# Patient Record
Sex: Female | Born: 1940 | Race: White | Hispanic: No | Marital: Married | Smoking: Never smoker
Health system: Southern US, Community
[De-identification: ages and names within clinical notes are randomized; demographics above are authoritative.]

## PROBLEM LIST (undated history)

## (undated) DIAGNOSIS — I10 Essential (primary) hypertension: Secondary | ICD-10-CM

## (undated) DIAGNOSIS — G4733 Obstructive sleep apnea (adult) (pediatric): Secondary | ICD-10-CM

## (undated) DIAGNOSIS — Z9889 Other specified postprocedural states: Secondary | ICD-10-CM

## (undated) DIAGNOSIS — R51 Headache: Secondary | ICD-10-CM

## (undated) DIAGNOSIS — R519 Headache, unspecified: Secondary | ICD-10-CM

## (undated) DIAGNOSIS — K219 Gastro-esophageal reflux disease without esophagitis: Secondary | ICD-10-CM

## (undated) DIAGNOSIS — I2510 Atherosclerotic heart disease of native coronary artery without angina pectoris: Secondary | ICD-10-CM

## (undated) DIAGNOSIS — R112 Nausea with vomiting, unspecified: Secondary | ICD-10-CM

## (undated) DIAGNOSIS — M1990 Unspecified osteoarthritis, unspecified site: Secondary | ICD-10-CM

## (undated) DIAGNOSIS — E119 Type 2 diabetes mellitus without complications: Secondary | ICD-10-CM

## (undated) DIAGNOSIS — E785 Hyperlipidemia, unspecified: Secondary | ICD-10-CM

## (undated) HISTORY — PX: CORONARY ANGIOPLASTY: SHX604

## (undated) HISTORY — PX: TONSILLECTOMY AND ADENOIDECTOMY: SUR1326

## (undated) HISTORY — PX: CHOLECYSTECTOMY: SHX55

## (undated) HISTORY — PX: AXILLARY LYMPH NODE DISSECTION: SHX5229

## (undated) HISTORY — PX: VAGINAL HYSTERECTOMY: SUR661

---

## 2007-08-05 HISTORY — PX: CORONARY ARTERY BYPASS GRAFT: SHX141

## 2007-08-06 ENCOUNTER — Ambulatory Visit: Payer: Self-pay | Admitting: Surgery

## 2007-09-22 ENCOUNTER — Ambulatory Visit: Payer: Self-pay | Admitting: Thoracic Surgery (Cardiothoracic Vascular Surgery)

## 2007-11-24 ENCOUNTER — Ambulatory Visit: Payer: Self-pay | Admitting: Thoracic Surgery (Cardiothoracic Vascular Surgery)

## 2011-08-21 DIAGNOSIS — J209 Acute bronchitis, unspecified: Secondary | ICD-10-CM | POA: Diagnosis not present

## 2011-08-21 DIAGNOSIS — E663 Overweight: Secondary | ICD-10-CM | POA: Diagnosis not present

## 2011-10-28 DIAGNOSIS — E119 Type 2 diabetes mellitus without complications: Secondary | ICD-10-CM | POA: Diagnosis not present

## 2011-12-08 DIAGNOSIS — L570 Actinic keratosis: Secondary | ICD-10-CM | POA: Diagnosis not present

## 2011-12-08 DIAGNOSIS — L821 Other seborrheic keratosis: Secondary | ICD-10-CM | POA: Diagnosis not present

## 2011-12-18 DIAGNOSIS — M1710 Unilateral primary osteoarthritis, unspecified knee: Secondary | ICD-10-CM | POA: Diagnosis not present

## 2011-12-19 DIAGNOSIS — M1710 Unilateral primary osteoarthritis, unspecified knee: Secondary | ICD-10-CM | POA: Diagnosis not present

## 2011-12-25 DIAGNOSIS — M1710 Unilateral primary osteoarthritis, unspecified knee: Secondary | ICD-10-CM | POA: Diagnosis not present

## 2011-12-30 DIAGNOSIS — M1710 Unilateral primary osteoarthritis, unspecified knee: Secondary | ICD-10-CM | POA: Diagnosis not present

## 2012-01-01 DIAGNOSIS — M1710 Unilateral primary osteoarthritis, unspecified knee: Secondary | ICD-10-CM | POA: Diagnosis not present

## 2012-01-03 DIAGNOSIS — H66009 Acute suppurative otitis media without spontaneous rupture of ear drum, unspecified ear: Secondary | ICD-10-CM | POA: Diagnosis not present

## 2012-01-08 DIAGNOSIS — M1710 Unilateral primary osteoarthritis, unspecified knee: Secondary | ICD-10-CM | POA: Diagnosis not present

## 2012-02-19 DIAGNOSIS — E559 Vitamin D deficiency, unspecified: Secondary | ICD-10-CM | POA: Diagnosis not present

## 2012-02-19 DIAGNOSIS — E119 Type 2 diabetes mellitus without complications: Secondary | ICD-10-CM | POA: Diagnosis not present

## 2012-02-19 DIAGNOSIS — Z79899 Other long term (current) drug therapy: Secondary | ICD-10-CM | POA: Diagnosis not present

## 2012-02-19 DIAGNOSIS — M25569 Pain in unspecified knee: Secondary | ICD-10-CM | POA: Diagnosis not present

## 2012-03-21 DIAGNOSIS — J029 Acute pharyngitis, unspecified: Secondary | ICD-10-CM | POA: Diagnosis not present

## 2012-03-21 DIAGNOSIS — J019 Acute sinusitis, unspecified: Secondary | ICD-10-CM | POA: Diagnosis not present

## 2012-05-06 DIAGNOSIS — M1710 Unilateral primary osteoarthritis, unspecified knee: Secondary | ICD-10-CM | POA: Diagnosis not present

## 2012-05-06 DIAGNOSIS — M21169 Varus deformity, not elsewhere classified, unspecified knee: Secondary | ICD-10-CM | POA: Diagnosis not present

## 2012-05-06 DIAGNOSIS — M25569 Pain in unspecified knee: Secondary | ICD-10-CM | POA: Diagnosis not present

## 2012-05-06 DIAGNOSIS — R269 Unspecified abnormalities of gait and mobility: Secondary | ICD-10-CM | POA: Diagnosis not present

## 2012-05-11 DIAGNOSIS — L819 Disorder of pigmentation, unspecified: Secondary | ICD-10-CM | POA: Diagnosis not present

## 2012-05-28 DIAGNOSIS — J209 Acute bronchitis, unspecified: Secondary | ICD-10-CM | POA: Diagnosis not present

## 2012-06-03 DIAGNOSIS — J209 Acute bronchitis, unspecified: Secondary | ICD-10-CM | POA: Diagnosis not present

## 2012-06-03 DIAGNOSIS — E119 Type 2 diabetes mellitus without complications: Secondary | ICD-10-CM | POA: Diagnosis not present

## 2012-06-07 DIAGNOSIS — M940 Chondrocostal junction syndrome [Tietze]: Secondary | ICD-10-CM | POA: Diagnosis not present

## 2012-06-07 DIAGNOSIS — I10 Essential (primary) hypertension: Secondary | ICD-10-CM | POA: Diagnosis not present

## 2012-06-07 DIAGNOSIS — I2510 Atherosclerotic heart disease of native coronary artery without angina pectoris: Secondary | ICD-10-CM | POA: Diagnosis not present

## 2012-06-07 DIAGNOSIS — E785 Hyperlipidemia, unspecified: Secondary | ICD-10-CM | POA: Diagnosis not present

## 2012-06-15 DIAGNOSIS — I2510 Atherosclerotic heart disease of native coronary artery without angina pectoris: Secondary | ICD-10-CM | POA: Diagnosis not present

## 2012-06-15 DIAGNOSIS — E785 Hyperlipidemia, unspecified: Secondary | ICD-10-CM | POA: Diagnosis not present

## 2012-07-12 DIAGNOSIS — Z23 Encounter for immunization: Secondary | ICD-10-CM | POA: Diagnosis not present

## 2012-07-22 DIAGNOSIS — Z1231 Encounter for screening mammogram for malignant neoplasm of breast: Secondary | ICD-10-CM | POA: Diagnosis not present

## 2012-08-11 DIAGNOSIS — E119 Type 2 diabetes mellitus without complications: Secondary | ICD-10-CM | POA: Diagnosis not present

## 2012-08-11 DIAGNOSIS — E7800 Pure hypercholesterolemia, unspecified: Secondary | ICD-10-CM | POA: Diagnosis not present

## 2012-08-11 DIAGNOSIS — I10 Essential (primary) hypertension: Secondary | ICD-10-CM | POA: Diagnosis not present

## 2012-08-12 DIAGNOSIS — E559 Vitamin D deficiency, unspecified: Secondary | ICD-10-CM | POA: Diagnosis not present

## 2012-08-12 DIAGNOSIS — E119 Type 2 diabetes mellitus without complications: Secondary | ICD-10-CM | POA: Diagnosis not present

## 2012-08-12 DIAGNOSIS — Z79899 Other long term (current) drug therapy: Secondary | ICD-10-CM | POA: Diagnosis not present

## 2012-08-31 DIAGNOSIS — L820 Inflamed seborrheic keratosis: Secondary | ICD-10-CM | POA: Diagnosis not present

## 2012-10-13 DIAGNOSIS — R269 Unspecified abnormalities of gait and mobility: Secondary | ICD-10-CM | POA: Diagnosis not present

## 2012-10-13 DIAGNOSIS — M21169 Varus deformity, not elsewhere classified, unspecified knee: Secondary | ICD-10-CM | POA: Diagnosis not present

## 2012-10-13 DIAGNOSIS — M6281 Muscle weakness (generalized): Secondary | ICD-10-CM | POA: Diagnosis not present

## 2012-11-15 DIAGNOSIS — M1710 Unilateral primary osteoarthritis, unspecified knee: Secondary | ICD-10-CM | POA: Diagnosis not present

## 2012-11-15 DIAGNOSIS — J029 Acute pharyngitis, unspecified: Secondary | ICD-10-CM | POA: Diagnosis not present

## 2012-11-23 DIAGNOSIS — M1710 Unilateral primary osteoarthritis, unspecified knee: Secondary | ICD-10-CM | POA: Diagnosis not present

## 2012-11-29 DIAGNOSIS — M1710 Unilateral primary osteoarthritis, unspecified knee: Secondary | ICD-10-CM | POA: Diagnosis not present

## 2012-12-18 DIAGNOSIS — M25569 Pain in unspecified knee: Secondary | ICD-10-CM | POA: Diagnosis not present

## 2012-12-30 DIAGNOSIS — M1710 Unilateral primary osteoarthritis, unspecified knee: Secondary | ICD-10-CM | POA: Diagnosis not present

## 2013-02-09 DIAGNOSIS — E782 Mixed hyperlipidemia: Secondary | ICD-10-CM | POA: Diagnosis not present

## 2013-02-09 DIAGNOSIS — Z006 Encounter for examination for normal comparison and control in clinical research program: Secondary | ICD-10-CM | POA: Diagnosis not present

## 2013-02-09 DIAGNOSIS — E119 Type 2 diabetes mellitus without complications: Secondary | ICD-10-CM | POA: Diagnosis not present

## 2013-02-09 DIAGNOSIS — I10 Essential (primary) hypertension: Secondary | ICD-10-CM | POA: Diagnosis not present

## 2013-02-09 DIAGNOSIS — Z01818 Encounter for other preprocedural examination: Secondary | ICD-10-CM | POA: Diagnosis not present

## 2013-02-11 DIAGNOSIS — E785 Hyperlipidemia, unspecified: Secondary | ICD-10-CM | POA: Diagnosis not present

## 2013-02-11 DIAGNOSIS — I359 Nonrheumatic aortic valve disorder, unspecified: Secondary | ICD-10-CM | POA: Diagnosis not present

## 2013-02-11 DIAGNOSIS — I10 Essential (primary) hypertension: Secondary | ICD-10-CM | POA: Diagnosis not present

## 2013-02-11 DIAGNOSIS — I2510 Atherosclerotic heart disease of native coronary artery without angina pectoris: Secondary | ICD-10-CM | POA: Diagnosis not present

## 2013-02-11 DIAGNOSIS — M940 Chondrocostal junction syndrome [Tietze]: Secondary | ICD-10-CM | POA: Diagnosis not present

## 2013-02-16 ENCOUNTER — Encounter (HOSPITAL_COMMUNITY): Payer: Self-pay | Admitting: Pharmacy Technician

## 2013-02-18 ENCOUNTER — Other Ambulatory Visit: Payer: Self-pay | Admitting: Orthopedic Surgery

## 2013-02-21 DIAGNOSIS — M1710 Unilateral primary osteoarthritis, unspecified knee: Secondary | ICD-10-CM | POA: Diagnosis not present

## 2013-02-22 ENCOUNTER — Encounter (HOSPITAL_COMMUNITY): Payer: Self-pay

## 2013-02-22 ENCOUNTER — Encounter (HOSPITAL_COMMUNITY)
Admission: RE | Admit: 2013-02-22 | Discharge: 2013-02-22 | Disposition: A | Discharge status: Home / Self Care | Payer: Medicare Other | Source: Ambulatory Visit | Attending: Orthopedic Surgery | Admitting: Orthopedic Surgery

## 2013-02-22 DIAGNOSIS — Z01811 Encounter for preprocedural respiratory examination: Secondary | ICD-10-CM | POA: Diagnosis not present

## 2013-02-22 DIAGNOSIS — Z01812 Encounter for preprocedural laboratory examination: Secondary | ICD-10-CM | POA: Diagnosis not present

## 2013-02-22 DIAGNOSIS — Z01818 Encounter for other preprocedural examination: Secondary | ICD-10-CM | POA: Diagnosis not present

## 2013-02-22 HISTORY — DX: Other specified postprocedural states: R11.2

## 2013-02-22 HISTORY — DX: Atherosclerotic heart disease of native coronary artery without angina pectoris: I25.10

## 2013-02-22 HISTORY — DX: Other specified postprocedural states: Z98.890

## 2013-02-22 HISTORY — DX: Gastro-esophageal reflux disease without esophagitis: K21.9

## 2013-02-22 HISTORY — DX: Essential (primary) hypertension: I10

## 2013-02-22 HISTORY — DX: Hyperlipidemia, unspecified: E78.5

## 2013-02-22 LAB — COMPREHENSIVE METABOLIC PANEL
ALT: 19 U/L (ref 0–35)
AST: 16 U/L (ref 0–37)
Albumin: 4.1 g/dL (ref 3.5–5.2)
Alkaline Phosphatase: 85 U/L (ref 39–117)
Calcium: 10.2 mg/dL (ref 8.4–10.5)
GFR calc Af Amer: >90 mL/min (ref >90)
Glucose, Bld: 106 mg/dL — ABNORMAL HIGH (ref 70–99)
Potassium: 3.7 mEq/L (ref 3.5–5.1)
Sodium: 135 mEq/L (ref 135–145)
Total Protein: 7.1 g/dL (ref 6.0–8.3)

## 2013-02-22 LAB — URINALYSIS, ROUTINE W REFLEX MICROSCOPIC
Glucose, UA: NEGATIVE mg/dL
Hgb urine dipstick: NEGATIVE
Leukocytes, UA: NEGATIVE
Specific Gravity, Urine: 1.017 (ref 1.005–1.030)
pH: 5.5 (ref 5.0–8.0)

## 2013-02-22 LAB — CBC WITH DIFFERENTIAL/PLATELET
Basophils Absolute: 0 10*3/uL (ref 0.0–0.1)
Eosinophils Absolute: 0.3 10*3/uL (ref 0.0–0.7)
Lymphs Abs: 2.6 10*3/uL (ref 0.7–4.0)
MCH: 28.7 pg (ref 26.0–34.0)
Neutrophils Relative %: 60 % (ref 43–77)
Platelets: 216 10*3/uL (ref 150–400)
RBC: 4.81 MIL/uL (ref 3.87–5.11)
RDW: 14.4 % (ref 11.5–15.5)
WBC: 9.3 10*3/uL (ref 4.0–10.5)

## 2013-02-22 LAB — TYPE AND SCREEN: Antibody Screen: NEGATIVE

## 2013-02-22 LAB — SURGICAL PCR SCREEN: Staphylococcus aureus: NEGATIVE

## 2013-02-22 MED ORDER — CHLORHEXIDINE GLUCONATE 4 % EX LIQD: 60.0000 mL | Freq: Once | CUTANEOUS | Status: DC

## 2013-02-22 NOTE — Pre-Procedure Instructions (Addendum)
Zamorah Ailes Cassis  02/22/2013   Your procedure is scheduled on:  March 07, 2013  Report to [facility] at 8 AM.  Call this number if you have problems the morning of surgery: [phone]   Remember:   Do not eat food or drink liquids after midnight.   Take these medicines the morning of surgery with A SIP OF WATER: omeprazole (PRILOSEC) 20 MG capsule    Do not wear jewelry, make-up or nail polish.  Do not wear lotions, powders, or perfumes. You may wear deodorant.  Do not shave 48 hours prior to surgery. Men may shave face and neck.  Do not bring valuables to the hospital.  [facility] is not responsible for any belongings or valuables.  Contacts, dentures or bridgework may not be worn into surgery.  Leave suitcase in the car. After surgery it may be brought to your room.  For patients admitted to the hospital, checkout time is 11:00 AM the day of discharge.   Patients discharged the day of surgery will not be allowed to drive home.  Name and phone number of your driver:   Special Instructions: Shower using CHG 2 nights before surgery and the night before surgery.  If you shower the day of surgery use CHG.  Use special wash - you have one bottle of CHG for all showers.  You should use approximately 1/3 of the bottle for each shower.   Please read over the following fact sheets that you were given: Pain Booklet, Coughing and Deep Breathing, Blood Transfusion Information and Surgical Site Infection Prevention

## 2013-02-23 LAB — URINE CULTURE
Colony Count: NO GROWTH
Culture: NO GROWTH

## 2013-02-23 NOTE — Progress Notes (Addendum)
Anesthesia Chart Review:  Patient is a 72 year old female scheduled for right partial knee replacement (medial compartment) on 03/07/13 by Dr. Sherlean Foot.    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    Cardiologist is Dr. Dulce Sellar with [facility]) in [place].  He saw patient on 02/11/13 for pre-operative evaluation.  Functional capacity was > 4 METS. EKG then showed SR, low voltage in precordial leads, poor r wave progression--may be secondary to pulmonary disease, consider old anterior infarct.  No further cardiac evaluation was recommended prior to surgery.    Nuclear stress test on 01/25/10 at [facility]) showed no perfusion defects, normal motion, EF 72%.    Echo on 01/25/10 ([facility]) showed AV sclerosis with mild AR, mild TR, normal pulmonary artery systolic pressure, EF 60%.  CXR on 02/22/13 showed borderline cardiomegaly, without acute disease.  Preoperative labs noted.  She has been cleared by her cardiologist.  She is obese with untreated OSA and made need post-operative RT evaluation. She will be evaluated by her assigned anesthesiologist on the day of surgery, but if no changes then anticipate that she can proceed as planned.  Velna Ochs [facility]/Anesthesiology Phone [phone] 02/23/2013 4:01 PM

## 2013-03-04 NOTE — Progress Notes (Signed)
Left message regarding time change with new arrival time of 07:30

## 2013-03-06 MED ORDER — CEFAZOLIN SODIUM-DEXTROSE 2-3 GM-% IV SOLR
2.0000 g | INTRAVENOUS | Status: AC
  Administered 2013-03-07: 2 g via INTRAVENOUS
  Filled 2013-03-06: qty 50

## 2013-03-06 MED ORDER — BUPIVACAINE LIPOSOME 1.3 % IJ SUSP
20.0000 mL | Freq: Once | INTRAMUSCULAR | Status: DC
  Filled 2013-03-06: qty 20

## 2013-03-06 MED ORDER — TRANEXAMIC ACID 100 MG/ML IV SOLN
1000.0000 mg | INTRAVENOUS | Status: AC
  Administered 2013-03-07: 1000 mg via INTRAVENOUS
  Filled 2013-03-06: qty 10

## 2013-03-06 MED ORDER — SODIUM CHLORIDE 0.9 % IV SOLN: INTRAVENOUS | Status: DC

## 2013-03-07 ENCOUNTER — Encounter (HOSPITAL_COMMUNITY): Payer: Self-pay | Admitting: Surgery

## 2013-03-07 ENCOUNTER — Inpatient Hospital Stay (HOSPITAL_COMMUNITY)
Admission: RE | Admit: 2013-03-07 | Discharge: 2013-03-09 | DRG: 470 | Disposition: A | Discharge status: Home Health | Payer: Medicare Other | Source: Ambulatory Visit | Attending: Orthopedic Surgery | Admitting: Orthopedic Surgery

## 2013-03-07 ENCOUNTER — Encounter (HOSPITAL_COMMUNITY)
Admission: RE | Disposition: A | Discharge status: Home Health | Payer: Self-pay | Source: Ambulatory Visit | Attending: Orthopedic Surgery

## 2013-03-07 ENCOUNTER — Encounter (HOSPITAL_COMMUNITY): Payer: Self-pay | Admitting: Vascular Surgery

## 2013-03-07 ENCOUNTER — Inpatient Hospital Stay (HOSPITAL_COMMUNITY): Payer: Medicare Other

## 2013-03-07 ENCOUNTER — Inpatient Hospital Stay (HOSPITAL_COMMUNITY): Payer: Medicare Other | Admitting: Anesthesiology

## 2013-03-07 DIAGNOSIS — Z96651 Presence of right artificial knee joint: Secondary | ICD-10-CM

## 2013-03-07 DIAGNOSIS — E785 Hyperlipidemia, unspecified: Secondary | ICD-10-CM | POA: Diagnosis not present

## 2013-03-07 DIAGNOSIS — I2510 Atherosclerotic heart disease of native coronary artery without angina pectoris: Secondary | ICD-10-CM | POA: Diagnosis present

## 2013-03-07 DIAGNOSIS — I10 Essential (primary) hypertension: Secondary | ICD-10-CM | POA: Diagnosis present

## 2013-03-07 DIAGNOSIS — Z951 Presence of aortocoronary bypass graft: Secondary | ICD-10-CM | POA: Diagnosis not present

## 2013-03-07 DIAGNOSIS — M25569 Pain in unspecified knee: Secondary | ICD-10-CM | POA: Diagnosis not present

## 2013-03-07 DIAGNOSIS — G8918 Other acute postprocedural pain: Secondary | ICD-10-CM | POA: Diagnosis not present

## 2013-03-07 DIAGNOSIS — K219 Gastro-esophageal reflux disease without esophagitis: Secondary | ICD-10-CM | POA: Diagnosis not present

## 2013-03-07 DIAGNOSIS — IMO0002 Reserved for concepts with insufficient information to code with codable children: Secondary | ICD-10-CM | POA: Diagnosis not present

## 2013-03-07 DIAGNOSIS — G4730 Sleep apnea, unspecified: Secondary | ICD-10-CM | POA: Diagnosis present

## 2013-03-07 DIAGNOSIS — M1710 Unilateral primary osteoarthritis, unspecified knee: Principal | ICD-10-CM | POA: Diagnosis present

## 2013-03-07 DIAGNOSIS — Z888 Allergy status to other drugs, medicaments and biological substances status: Secondary | ICD-10-CM

## 2013-03-07 DIAGNOSIS — D62 Acute posthemorrhagic anemia: Secondary | ICD-10-CM | POA: Diagnosis not present

## 2013-03-07 DIAGNOSIS — Z9089 Acquired absence of other organs: Secondary | ICD-10-CM

## 2013-03-07 DIAGNOSIS — E119 Type 2 diabetes mellitus without complications: Secondary | ICD-10-CM | POA: Diagnosis present

## 2013-03-07 HISTORY — DX: Type 2 diabetes mellitus without complications: E11.9

## 2013-03-07 HISTORY — DX: Unspecified osteoarthritis, unspecified site: M19.90

## 2013-03-07 HISTORY — PX: MEDIAL PARTIAL KNEE REPLACEMENT: SHX5965

## 2013-03-07 HISTORY — DX: Obstructive sleep apnea (adult) (pediatric): G47.33

## 2013-03-07 HISTORY — DX: Headache, unspecified: R51.9

## 2013-03-07 HISTORY — DX: Headache: R51

## 2013-03-07 LAB — CREATININE, SERUM
Creatinine, Ser: 0.54 mg/dL (ref 0.50–1.10)
GFR calc Af Amer: >90 mL/min (ref >90)
GFR calc non Af Amer: >90 mL/min (ref >90)

## 2013-03-07 LAB — CBC
Hemoglobin: 12.8 g/dL (ref 12.0–15.0)
MCHC: 33.7 g/dL (ref 30.0–36.0)
RDW: 14.7 % (ref 11.5–15.5)
WBC: 12.6 10*3/uL — ABNORMAL HIGH (ref 4.0–10.5)

## 2013-03-07 LAB — HEMOGLOBIN A1C
Hgb A1c MFr Bld: 6.3 % — ABNORMAL HIGH (ref <5.7)
Mean Plasma Glucose: 134 mg/dL — ABNORMAL HIGH (ref <117)

## 2013-03-07 LAB — GLUCOSE, CAPILLARY

## 2013-03-07 SURGERY — MEDIAL PARTIAL KNEE REPLACEMENT
Anesthesia: Regional | Site: Knee | Laterality: Right | Wound class: Clean

## 2013-03-07 MED ORDER — HYDROMORPHONE HCL PF 1 MG/ML IJ SOLN
0.2500 mg | INTRAMUSCULAR | Status: DC | PRN
  Administered 2013-03-07 (×2): 0.5 mg via INTRAVENOUS

## 2013-03-07 MED ORDER — ENOXAPARIN SODIUM 30 MG/0.3ML ~~LOC~~ SOLN
30.0000 mg | Freq: Two times a day (BID) | SUBCUTANEOUS | Status: DC
  Administered 2013-03-08 – 2013-03-09 (×3): 30 mg via SUBCUTANEOUS
  Filled 2013-03-07 (×5): qty 0.3

## 2013-03-07 MED ORDER — METHOCARBAMOL 500 MG PO TABS: 500.0000 mg | ORAL_TABLET | Freq: Four times a day (QID) | ORAL | Status: DC | PRN

## 2013-03-07 MED ORDER — MIDAZOLAM HCL 5 MG/ML IJ SOLN
1.0000 mg | Freq: Once | INTRAMUSCULAR | Status: AC
  Administered 2013-03-07: 1 mg via INTRAVENOUS

## 2013-03-07 MED ORDER — ONDANSETRON HCL 4 MG PO TABS: 4.0000 mg | ORAL_TABLET | Freq: Four times a day (QID) | ORAL | Status: DC | PRN

## 2013-03-07 MED ORDER — ONDANSETRON HCL 4 MG/2ML IJ SOLN
INTRAMUSCULAR | Status: DC | PRN
  Administered 2013-03-07 (×2): 4 mg via INTRAVENOUS

## 2013-03-07 MED ORDER — HYDROMORPHONE HCL PF 1 MG/ML IJ SOLN
INTRAMUSCULAR | Status: AC
  Filled 2013-03-07: qty 1

## 2013-03-07 MED ORDER — PHENOL 1.4 % MT LIQD: 1.0000 | OROMUCOSAL | Status: DC | PRN

## 2013-03-07 MED ORDER — PROPOFOL 10 MG/ML IV BOLUS
INTRAVENOUS | Status: DC | PRN
  Administered 2013-03-07: 150 mg via INTRAVENOUS

## 2013-03-07 MED ORDER — HYDROCHLOROTHIAZIDE 25 MG PO TABS
25.0000 mg | ORAL_TABLET | Freq: Every day | ORAL | Status: DC
  Administered 2013-03-08 – 2013-03-09 (×2): 25 mg via ORAL
  Filled 2013-03-07 (×2): qty 1

## 2013-03-07 MED ORDER — LISINOPRIL 20 MG PO TABS
20.0000 mg | ORAL_TABLET | Freq: Every day | ORAL | Status: DC
  Administered 2013-03-08 – 2013-03-09 (×2): 20 mg via ORAL
  Filled 2013-03-07 (×2): qty 1

## 2013-03-07 MED ORDER — ACETAMINOPHEN 325 MG PO TABS: 650.0000 mg | ORAL_TABLET | Freq: Four times a day (QID) | ORAL | Status: DC | PRN

## 2013-03-07 MED ORDER — METFORMIN HCL ER 500 MG PO TB24
500.0000 mg | ORAL_TABLET | Freq: Two times a day (BID) | ORAL | Status: DC
  Administered 2013-03-07 – 2013-03-09 (×4): 500 mg via ORAL
  Filled 2013-03-07 (×6): qty 1

## 2013-03-07 MED ORDER — METHOCARBAMOL 100 MG/ML IJ SOLN
500.0000 mg | Freq: Four times a day (QID) | INTRAVENOUS | Status: DC | PRN
  Filled 2013-03-07: qty 5

## 2013-03-07 MED ORDER — SODIUM CHLORIDE 0.9 % IV SOLN
INTRAVENOUS | Status: DC
  Administered 2013-03-07 – 2013-03-08 (×2): via INTRAVENOUS

## 2013-03-07 MED ORDER — CELECOXIB 200 MG PO CAPS
200.0000 mg | ORAL_CAPSULE | Freq: Two times a day (BID) | ORAL | Status: DC
  Administered 2013-03-07 – 2013-03-09 (×4): 200 mg via ORAL
  Filled 2013-03-07 (×5): qty 1

## 2013-03-07 MED ORDER — DOCUSATE SODIUM 100 MG PO CAPS
100.0000 mg | ORAL_CAPSULE | Freq: Two times a day (BID) | ORAL | Status: DC
  Administered 2013-03-07 – 2013-03-09 (×4): 100 mg via ORAL
  Filled 2013-03-07 (×5): qty 1

## 2013-03-07 MED ORDER — SODIUM CHLORIDE 0.9 % IJ SOLN
INTRAMUSCULAR | Status: DC | PRN
  Administered 2013-03-07: 10:00:00

## 2013-03-07 MED ORDER — LACTATED RINGERS IV SOLN
INTRAVENOUS | Status: DC
  Administered 2013-03-07: 08:00:00 via INTRAVENOUS

## 2013-03-07 MED ORDER — LISINOPRIL-HYDROCHLOROTHIAZIDE 20-25 MG PO TABS: 1.0000 | ORAL_TABLET | Freq: Every day | ORAL | Status: DC

## 2013-03-07 MED ORDER — LIDOCAINE HCL (CARDIAC) 20 MG/ML IV SOLN
INTRAVENOUS | Status: DC | PRN
  Administered 2013-03-07: 50 mg via INTRAVENOUS

## 2013-03-07 MED ORDER — BUPIVACAINE HCL (PF) 0.5 % IJ SOLN
INTRAMUSCULAR | Status: AC
  Filled 2013-03-07: qty 30

## 2013-03-07 MED ORDER — ALUM & MAG HYDROXIDE-SIMETH 200-200-20 MG/5ML PO SUSP: 30.0000 mL | ORAL | Status: DC | PRN

## 2013-03-07 MED ORDER — MIDAZOLAM HCL 2 MG/2ML IJ SOLN
INTRAMUSCULAR | Status: AC
  Filled 2013-03-07: qty 2

## 2013-03-07 MED ORDER — ONDANSETRON HCL 4 MG/2ML IJ SOLN
4.0000 mg | Freq: Four times a day (QID) | INTRAMUSCULAR | Status: DC | PRN
  Administered 2013-03-07: 4 mg via INTRAVENOUS
  Filled 2013-03-07: qty 2

## 2013-03-07 MED ORDER — BUPIVACAINE HCL (PF) 0.25 % IJ SOLN
INTRAMUSCULAR | Status: DC | PRN
  Administered 2013-03-07: 30 mL

## 2013-03-07 MED ORDER — FENTANYL CITRATE 0.05 MG/ML IJ SOLN: 50.0000 ug | Freq: Once | INTRAMUSCULAR | Status: AC

## 2013-03-07 MED ORDER — ROCURONIUM BROMIDE 100 MG/10ML IV SOLN
INTRAVENOUS | Status: DC | PRN
  Administered 2013-03-07: 40 mg via INTRAVENOUS

## 2013-03-07 MED ORDER — LACTATED RINGERS IV SOLN
INTRAVENOUS | Status: DC | PRN
  Administered 2013-03-07 (×2): via INTRAVENOUS

## 2013-03-07 MED ORDER — GLYCOPYRROLATE 0.2 MG/ML IJ SOLN
INTRAMUSCULAR | Status: DC | PRN
  Administered 2013-03-07: 0.4 mg via INTRAVENOUS

## 2013-03-07 MED ORDER — ACETAMINOPHEN 650 MG RE SUPP: 650.0000 mg | Freq: Four times a day (QID) | RECTAL | Status: DC | PRN

## 2013-03-07 MED ORDER — PANTOPRAZOLE SODIUM 40 MG PO TBEC
40.0000 mg | DELAYED_RELEASE_TABLET | Freq: Every day | ORAL | Status: DC
  Administered 2013-03-08 – 2013-03-09 (×2): 40 mg via ORAL
  Filled 2013-03-07 (×2): qty 1

## 2013-03-07 MED ORDER — BISACODYL 5 MG PO TBEC: 5.0000 mg | DELAYED_RELEASE_TABLET | Freq: Every day | ORAL | Status: DC | PRN

## 2013-03-07 MED ORDER — DIPHENHYDRAMINE HCL 12.5 MG/5ML PO ELIX
12.5000 mg | ORAL_SOLUTION | ORAL | Status: DC | PRN
  Administered 2013-03-08: 25 mg via ORAL
  Filled 2013-03-07: qty 10

## 2013-03-07 MED ORDER — HYDROCODONE-ACETAMINOPHEN 7.5-325 MG PO TABS
1.0000 | ORAL_TABLET | ORAL | Status: DC | PRN
  Administered 2013-03-08 – 2013-03-09 (×5): 2 via ORAL
  Filled 2013-03-07 (×5): qty 2

## 2013-03-07 MED ORDER — NEOSTIGMINE METHYLSULFATE 1 MG/ML IJ SOLN
INTRAMUSCULAR | Status: DC | PRN
  Administered 2013-03-07: 3 mg via INTRAVENOUS

## 2013-03-07 MED ORDER — INSULIN ASPART 100 UNIT/ML ~~LOC~~ SOLN
0.0000 [IU] | Freq: Three times a day (TID) | SUBCUTANEOUS | Status: DC
  Administered 2013-03-07 – 2013-03-08 (×2): 2 [IU] via SUBCUTANEOUS
  Administered 2013-03-08 (×2): 3 [IU] via SUBCUTANEOUS
  Administered 2013-03-09: 2 [IU] via SUBCUTANEOUS

## 2013-03-07 MED ORDER — ZOLPIDEM TARTRATE 5 MG PO TABS: 5.0000 mg | ORAL_TABLET | Freq: Every evening | ORAL | Status: DC | PRN

## 2013-03-07 MED ORDER — FLUTICASONE PROPIONATE HFA 44 MCG/ACT IN AERO
1.0000 | INHALATION_SPRAY | Freq: Two times a day (BID) | RESPIRATORY_TRACT | Status: DC
  Administered 2013-03-07 – 2013-03-08 (×3): 1 via RESPIRATORY_TRACT
  Filled 2013-03-07: qty 10.6

## 2013-03-07 MED ORDER — METOCLOPRAMIDE HCL 5 MG/ML IJ SOLN
5.0000 mg | Freq: Three times a day (TID) | INTRAMUSCULAR | Status: DC | PRN
  Administered 2013-03-07: 10 mg via INTRAVENOUS
  Filled 2013-03-07: qty 2

## 2013-03-07 MED ORDER — SODIUM CHLORIDE 0.9 % IR SOLN
Status: DC | PRN
  Administered 2013-03-07: 3000 mL

## 2013-03-07 MED ORDER — FENTANYL CITRATE 0.05 MG/ML IJ SOLN
INTRAMUSCULAR | Status: DC | PRN
  Administered 2013-03-07: 100 ug via INTRAVENOUS
  Administered 2013-03-07: 50 ug via INTRAVENOUS

## 2013-03-07 MED ORDER — FLEET ENEMA 7-19 GM/118ML RE ENEM: 1.0000 | ENEMA | Freq: Once | RECTAL | Status: AC | PRN

## 2013-03-07 MED ORDER — 0.9 % SODIUM CHLORIDE (POUR BTL) OPTIME
TOPICAL | Status: DC | PRN
  Administered 2013-03-07: 1000 mL

## 2013-03-07 MED ORDER — SENNOSIDES-DOCUSATE SODIUM 8.6-50 MG PO TABS: 1.0000 | ORAL_TABLET | Freq: Every evening | ORAL | Status: DC | PRN

## 2013-03-07 MED ORDER — HYDROMORPHONE HCL PF 1 MG/ML IJ SOLN
1.0000 mg | INTRAMUSCULAR | Status: DC | PRN
  Administered 2013-03-07 – 2013-03-08 (×4): 1 mg via INTRAVENOUS
  Filled 2013-03-07 (×3): qty 1

## 2013-03-07 MED ORDER — FENTANYL CITRATE 0.05 MG/ML IJ SOLN
INTRAMUSCULAR | Status: AC
  Administered 2013-03-07: 50 ug via INTRAVENOUS
  Filled 2013-03-07: qty 2

## 2013-03-07 MED ORDER — MENTHOL 3 MG MT LOZG: 1.0000 | LOZENGE | OROMUCOSAL | Status: DC | PRN

## 2013-03-07 MED ORDER — ARTIFICIAL TEARS OP OINT
TOPICAL_OINTMENT | OPHTHALMIC | Status: DC | PRN
  Administered 2013-03-07: 1 via OPHTHALMIC

## 2013-03-07 MED ORDER — SIMVASTATIN 40 MG PO TABS
40.0000 mg | ORAL_TABLET | Freq: Every day | ORAL | Status: DC
  Administered 2013-03-07 – 2013-03-08 (×2): 40 mg via ORAL
  Filled 2013-03-07 (×3): qty 1

## 2013-03-07 MED ORDER — CEFAZOLIN SODIUM 1-5 GM-% IV SOLN
1.0000 g | Freq: Four times a day (QID) | INTRAVENOUS | Status: AC
  Administered 2013-03-07 – 2013-03-08 (×2): 1 g via INTRAVENOUS
  Filled 2013-03-07 (×2): qty 50

## 2013-03-07 MED ORDER — ONDANSETRON HCL 4 MG/2ML IJ SOLN: 4.0000 mg | Freq: Once | INTRAMUSCULAR | Status: DC | PRN

## 2013-03-07 MED ORDER — METOCLOPRAMIDE HCL 10 MG PO TABS: 5.0000 mg | ORAL_TABLET | Freq: Three times a day (TID) | ORAL | Status: DC | PRN

## 2013-03-07 SURGICAL SUPPLY — 69 items
BANDAGE ELASTIC 6 VELCRO ST LF (GAUZE/BANDAGES/DRESSINGS) ×2 IMPLANT
BANDAGE ESMARK 6X9 LF (GAUZE/BANDAGES/DRESSINGS) ×1 IMPLANT
BLADE SAW RECIP 87.9 MT (BLADE) ×2 IMPLANT
BLADE SAW SGTL 13X75X1.27 (BLADE) ×2 IMPLANT
BLADE SAW SGTL 83.5X18.5 (BLADE) ×2 IMPLANT
BNDG ELASTIC 6X10 VLCR STRL LF (GAUZE/BANDAGES/DRESSINGS) ×2 IMPLANT
BNDG ESMARK 6X9 LF (GAUZE/BANDAGES/DRESSINGS) ×2
BOWL SMART MIX CTS (DISPOSABLE) ×2 IMPLANT
CAP CEM FLX UNI FM/TB/SUR HD ×2 IMPLANT
CEMENT BONE SIMPLEX SPEEDSET (Cement) ×2 IMPLANT
CLOTH BEACON ORANGE TIMEOUT ST (SAFETY) ×2 IMPLANT
COVER BACK TABLE 24X17X13 BIG (DRAPES) IMPLANT
COVER SURGICAL LIGHT HANDLE (MISCELLANEOUS) ×4 IMPLANT
CUFF TOURNIQUET SINGLE 34IN LL (TOURNIQUET CUFF) ×2 IMPLANT
DRAPE C-ARM 42X72 X-RAY (DRAPES) ×2 IMPLANT
DRAPE EXTREMITY T 121X128X90 (DRAPE) ×2 IMPLANT
DRAPE INCISE IOBAN 66X45 STRL (DRAPES) ×6 IMPLANT
DRAPE PROXIMA HALF (DRAPES) ×2 IMPLANT
DRAPE U-SHAPE 47X51 STRL (DRAPES) ×2 IMPLANT
DRSG ADAPTIC 3X8 NADH LF (GAUZE/BANDAGES/DRESSINGS) ×2 IMPLANT
DRSG PAD ABDOMINAL 8X10 ST (GAUZE/BANDAGES/DRESSINGS) ×2 IMPLANT
DURAPREP 26ML APPLICATOR (WOUND CARE) ×2 IMPLANT
ELECT REM PT RETURN 9FT ADLT (ELECTROSURGICAL) ×2
ELECTRODE REM PT RTRN 9FT ADLT (ELECTROSURGICAL) ×1 IMPLANT
EVACUATOR 1/8 PVC DRAIN (DRAIN) ×2 IMPLANT
FLUID NSS /IRRIG 3000 ML XXX (IV SOLUTION) ×2 IMPLANT
GLOVE BIO SURGEON STRL SZ 6.5 (GLOVE) ×6 IMPLANT
GLOVE BIOGEL M 7.0 STRL (GLOVE) ×2 IMPLANT
GLOVE BIOGEL PI IND STRL 7.0 (GLOVE) ×2 IMPLANT
GLOVE BIOGEL PI IND STRL 7.5 (GLOVE) ×1 IMPLANT
GLOVE BIOGEL PI IND STRL 8.5 (GLOVE) ×2 IMPLANT
GLOVE BIOGEL PI INDICATOR 7.0 (GLOVE) ×2
GLOVE BIOGEL PI INDICATOR 7.5 (GLOVE) ×1
GLOVE BIOGEL PI INDICATOR 8.5 (GLOVE) ×2
GLOVE SURG ORTHO 8.0 STRL STRW (GLOVE) ×8 IMPLANT
GOWN PREVENTION PLUS XLARGE (GOWN DISPOSABLE) ×4 IMPLANT
GOWN STRL NON-REIN LRG LVL3 (GOWN DISPOSABLE) ×4 IMPLANT
HANDPIECE INTERPULSE COAX TIP (DISPOSABLE) ×1
HEADED SCREW ×4 IMPLANT
HOOD PEEL AWAY FACE SHEILD DIS (HOOD) ×10 IMPLANT
INSERTER TIP ×2 IMPLANT
KIT BASIN OR (CUSTOM PROCEDURE TRAY) ×2 IMPLANT
KIT ROOM TURNOVER OR (KITS) ×2 IMPLANT
MANIFOLD NEPTUNE II (INSTRUMENTS) ×2 IMPLANT
NEEDLE 22X1 1/2 (OR ONLY) (NEEDLE) ×2 IMPLANT
NS IRRIG 1000ML POUR BTL (IV SOLUTION) ×2 IMPLANT
PACK TOTAL JOINT (CUSTOM PROCEDURE TRAY) ×2 IMPLANT
PAD ARMBOARD 7.5X6 YLW CONV (MISCELLANEOUS) ×4 IMPLANT
PAD CAST 4YDX4 CTTN HI CHSV (CAST SUPPLIES) ×1 IMPLANT
PADDING CAST COTTON 4X4 STRL (CAST SUPPLIES) ×1
PADDING CAST COTTON 6X4 STRL (CAST SUPPLIES) ×2 IMPLANT
POSITIONER HEAD PRONE TRACH (MISCELLANEOUS) ×2 IMPLANT
SCREW HEADED 33MM (Screw) ×4 IMPLANT
SET HNDPC FAN SPRY TIP SCT (DISPOSABLE) ×1 IMPLANT
SPONGE GAUZE 4X4 12PLY (GAUZE/BANDAGES/DRESSINGS) ×2 IMPLANT
STAPLER VISISTAT 35W (STAPLE) ×2 IMPLANT
SUCTION FRAZIER TIP 10 FR DISP (SUCTIONS) ×2 IMPLANT
SUT VIC AB 0 CT1 27 (SUTURE) ×2
SUT VIC AB 0 CT1 27XBRD ANBCTR (SUTURE) ×2 IMPLANT
SUT VIC AB 1 CT1 27 (SUTURE) ×1
SUT VIC AB 1 CT1 27XBRD ANBCTR (SUTURE) ×1 IMPLANT
SUT VIC AB 2-0 CT1 27 (SUTURE) ×1
SUT VIC AB 2-0 CT1 TAPERPNT 27 (SUTURE) ×1 IMPLANT
SYR 50ML LL SCALE MARK (SYRINGE) ×2 IMPLANT
SYR CONTROL 10ML LL (SYRINGE) ×2 IMPLANT
TOWEL OR 17X24 6PK STRL BLUE (TOWEL DISPOSABLE) ×2 IMPLANT
TOWEL OR 17X26 10 PK STRL BLUE (TOWEL DISPOSABLE) ×2 IMPLANT
TRAY TIBIAL INSERTER TIP (Orthopedic Implant) ×2 IMPLANT
WATER STERILE IRR 1000ML POUR (IV SOLUTION) ×6 IMPLANT

## 2013-03-07 NOTE — Progress Notes (Signed)
Orthopedic Tech Progress Note Patient Details:  Patricia Dawson 11-04-1940 [id number] Applied CPM to RLE.  Applied OHF with trapeze to bed. CPM Right Knee CPM Right Knee: On Right Knee Flexion (Degrees): 90 Right Knee Extension (Degrees): 0   Lesle Chris 03/07/2013, 11:42 AM

## 2013-03-07 NOTE — H&P (Signed)
  Patricia Dawson MRN:  [id number] DOB/SEX:  02-09-1941/female  CHIEF COMPLAINT:  Painful right Knee  HISTORY: Patient is a 72 y.o. female presented with a history of pain in the right knee. Onset of symptoms was gradual starting several months ago with gradually worsening course since that time. Prior procedures on the knee include none. Patient has been treated conservatively with over-the-counter NSAIDs and activity modification. Patient currently rates pain in the knee at 8 out of 10 with activity. There is no pain at night.  PAST MEDICAL HISTORY: There are no active problems to display for this patient.  Past Medical History  Diagnosis Date  . PONV (postoperative nausea and vomiting)   . Hypertension   . GERD (gastroesophageal reflux disease)   . Diabetes mellitus without complication   . Hyperlipemia   . Sleep apnea   . Coronary artery disease    Past Surgical History  Procedure Laterality Date  . Coronary artery bypass graft  2009  . Abdominal hysterectomy    . Cholecystectomy       MEDICATIONS:   No prescriptions prior to admission    ALLERGIES:   Allergies  Allergen Reactions  . Codeine Other (See Comments)    Lightheaded, nausea, breaks out in a sweat  . Darvocet (Propoxyphene-Acetaminophen) Other (See Comments)    Lightheaded, nausea, breaks out in a sweat  . Percocet (Oxycodone-Acetaminophen) Other (See Comments)    Lightheaded, nausea, breaks out in a sweat  . Tramadol Other (See Comments)    Lightheaded, nausea, breaks out in a sweat    REVIEW OF SYSTEMS:  Pertinent items are noted in HPI.   FAMILY HISTORY:  No family history on file.  SOCIAL HISTORY:   History  Substance Use Topics  . Smoking status: Never Smoker   . Smokeless tobacco: Not on file  . Alcohol Use: No     EXAMINATION:  Vital signs in last 24 hours:    General appearance: alert, cooperative and no distress Lungs: clear to auscultation bilaterally Heart: regular rate and  rhythm, S1, S2 normal, no murmur, click, rub or gallop Abdomen: soft, non-tender; bowel sounds normal; no masses,  no organomegaly Pulses: 2+ and symmetric Skin: Skin color, texture, turgor normal. No rashes or lesions Neurologic: Alert and oriented X 3, normal strength and tone. Normal symmetric reflexes. Normal coordination and gait  Musculoskeletal:  ROM 0-120, Ligaments intact,  Imaging Review Plain radiographs demonstrate severe degenerative joint disease of the right knee. The overall alignment is mild valgus. The bone quality appears to be good for age and reported activity level.  Assessment/Plan: End stage arthritis, right knee, medial compartment  The patient history, physical examination and imaging studies are consistent with advanced degenerative joint disease of the left knee. The patient has failed conservative treatment.  The clearance notes were reviewed.  After discussion with the patient it was felt that medial compartment Knee Replacement was indicated. The procedure,  risks, and benefits of total knee arthroplasty were presented and reviewed. The risks including but not limited to aseptic loosening, infection, blood clots, vascular injury, stiffness, patella tracking problems complications among others were discussed. The patient acknowledged the explanation, agreed to proceed with the plan.  Patricia Dawson 03/07/2013, 6:56 AM

## 2013-03-07 NOTE — Progress Notes (Signed)
   CARE MANAGEMENT NOTE 03/07/2013  Patient:  Patricia Dawson, Patricia Dawson   Account Number:  [id number]  Date Initiated:  03/07/2013  Documentation initiated by:  [facility]  Subjective/Objective Assessment:     Action/Plan:   Anticipated DC Date:     Anticipated DC Plan:  HOME W HOME HEALTH SERVICES      DC Planning Services  CM consult      Choice offered to / List presented to:             Status of service:  In process, will continue to follow Medicare Important Message given?   (If response is "NO", the following Medicare IM given date fields will be blank) Date Medicare IM given:   Date Additional Medicare IM given:    Discharge Disposition:    Per UR Regulation:    If discussed at Long Length of Stay Meetings, dates discussed:    Comments:  03/07/2013 1500 Received call from [place] and they will be following for [facility]. Waiting PT/OT recommendations. Isidoro Donning RN CCM Case Mgmt phone [phone]

## 2013-03-07 NOTE — Anesthesia Postprocedure Evaluation (Signed)
  Anesthesia Post-op Note  Patient: Patricia Dawson  Procedure(s) Performed: Procedure(s): MEDIAL PARTIAL KNEE REPLACEMENT (Right)  Patient Location: PACU  Anesthesia Type:GA combined with regional for post-op pain  Level of Consciousness: awake  Airway and Oxygen Therapy: Patient Spontanous Breathing and Patient connected to nasal cannula oxygen  Post-op Pain: mild  Post-op Assessment: Post-op Vital signs reviewed, Patient's Cardiovascular Status Stable, Respiratory Function Stable, Patent Airway and No signs of Nausea or vomiting  Post-op Vital Signs: Reviewed and stable  Complications: No apparent anesthesia complications

## 2013-03-07 NOTE — Evaluation (Signed)
Physical Therapy Evaluation Patient Details Name: Patricia Dawson MRN: [id number] DOB: Dec 15, 1940 Today's Date: 03/07/2013 Time: 4098-1191 PT Time Calculation (min): 18 min  PT Assessment / Plan / Recommendation History of Present Illness  Patient is a 72 yo female s/p Rt partial knee replacement.  Clinical Impression  Patient presents with problems listed below.  Session limited today due to nausea.  Will benefit from acute PT to maximize independence prior to return home with husband.    PT Assessment  Patient needs continued PT services    Follow Up Recommendations  Home health PT;Supervision/Assistance - 24 hour    Does the patient have the potential to tolerate intense rehabilitation      Barriers to Discharge        Equipment Recommendations  None recommended by PT    Recommendations for Other Services     Frequency 7X/week    Precautions / Restrictions Precautions Precautions: Knee;Fall Restrictions Weight Bearing Restrictions: Yes RLE Weight Bearing: Weight bearing as tolerated   Pertinent Vitals/Pain Nausea limiting mobility today.      Mobility  Bed Mobility Bed Mobility: Supine to Sit;Sitting - Scoot to Edge of Bed;Sit to Supine Supine to Sit: 3: Mod assist;HOB elevated;With rails Sitting - Scoot to Edge of Bed: 4: Min assist;With rail Sit to Supine: 3: Mod assist;HOB elevated Details for Bed Mobility Assistance: Verbal cues for technique.  Assist to move RLE off of bed and to raise trunk to sitting position.  Patient sat EOB x 8 minutes with good sitting balance.  Patient with nausea - unable to continue.  Returned to supine with mod assist to raise LE's onto bed. Transfers Transfers: Not assessed (Patient unable due to nausea)    Exercises Total Joint Exercises Ankle Circles/Pumps: AROM;Both;10 reps;Seated   PT Diagnosis: Difficulty walking;Generalized weakness;Acute pain  PT Problem List: Decreased strength;Decreased range of motion;Decreased activity  tolerance;Decreased balance;Decreased mobility;Decreased knowledge of use of DME;Decreased knowledge of precautions;Pain PT Treatment Interventions: DME instruction;Gait training;Stair training;Functional mobility training;Therapeutic exercise;Patient/family education     PT Goals(Current goals can be found in the care plan section) Acute Rehab PT Goals Patient Stated Goal: To return home PT Goal Formulation: With patient/family Time For Goal Achievement: 03/14/13 Potential to Achieve Goals: Good  Visit Information  Last PT Received On: 03/07/13 Assistance Needed: +1 PT/OT Co-Evaluation/Treatment: Yes History of Present Illness: Patient is a 72 yo female s/p Rt partial knee replacement.       Prior Functioning  Home Living Family/patient expects to be discharged to:: Private residence Living Arrangements: Spouse/significant other Available Help at Discharge: Family;Available 24 hours/day Type of Home: House Home Access: Stairs to enter [employer] of Steps: 4 Entrance Stairs-Rails: Left Home Layout: One level Home Equipment: Shower seat;Bedside commode;Walker - 2 wheels Prior Function Level of Independence: Independent with assistive device(s) (used walker sometimes) Communication Communication: No difficulties Dominant Hand: Right    Cognition  Cognition Arousal/Alertness: Lethargic;Suspect due to medications Behavior During Therapy: [facility] for tasks assessed/performed Overall Cognitive Status: Within Functional Limits for tasks assessed    Extremity/Trunk Assessment Upper Extremity Assessment Upper Extremity Assessment: Defer to OT evaluation Lower Extremity Assessment Lower Extremity Assessment: RLE deficits/detail RLE Deficits / Details: Decreased ROM and strength due to surgery.  Assist required to move RLE off of bed. RLE: Unable to fully assess due to pain   Balance Balance Balance Assessed: Yes Static Sitting Balance Static Sitting - Balance Support:  No upper extremity supported;Feet supported Static Sitting - Level of Assistance: 5: Stand by  assistance Static Sitting - Comment/# of Minutes: 8  End of Session PT - End of Session Equipment Utilized During Treatment: Oxygen Activity Tolerance: Patient limited by pain (Patient limited by nausea) Patient left: in bed;with call bell/phone within reach;with family/visitor present Nurse Communication: Mobility status (Nauseated) CPM Right Knee CPM Right Knee: Off  GP     Vena Austria 03/07/2013, 4:47 PM Durenda Hurt. Renaldo Fiddler, [facility] Acute Rehab Services Pager [phone]

## 2013-03-07 NOTE — Progress Notes (Signed)
Report given to Philip RN

## 2013-03-07 NOTE — Transfer of Care (Signed)
Immediate Anesthesia Transfer of Care Note  Patient: Patricia Dawson  Procedure(s) Performed: Procedure(s): MEDIAL PARTIAL KNEE REPLACEMENT (Right)  Patient Location: PACU  Anesthesia Type:GA combined with regional for post-op pain  Level of Consciousness: awake, alert  and oriented  Airway & Oxygen Therapy: Patient Spontanous Breathing and Patient connected to nasal cannula oxygen  Post-op Assessment: Report given to PACU RN  Post vital signs: Reviewed and stable  Complications: No apparent anesthesia complications

## 2013-03-07 NOTE — Preoperative (Signed)
Beta Blockers   Reason not to administer Beta Blockers:Not Applicable 

## 2013-03-07 NOTE — Anesthesia Preprocedure Evaluation (Addendum)
Anesthesia Evaluation  Patient identified by MRN, date of birth, ID band Patient awake    Reviewed: Allergy & Precautions, H&P , NPO status , Patient's Chart, lab work & pertinent test results  History of Anesthesia Complications (+) PONV  Airway Mallampati: II      Dental  (+) Teeth Intact and Dental Advisory Given   Pulmonary sleep apnea ,  breath sounds clear to auscultation        Cardiovascular hypertension, Pt. on medications + CAD and + CABG Rhythm:Regular Rate:Normal     Neuro/Psych    GI/Hepatic GERD-  Medicated,  Endo/Other  diabetes, Type 2  Renal/GU      Musculoskeletal   Abdominal   Peds  Hematology   Anesthesia Other Findings   Reproductive/Obstetrics                         Anesthesia Physical Anesthesia Plan  ASA: III  Anesthesia Plan: General   Post-op Pain Management:    Induction:   Airway Management Planned: LMA  Additional Equipment:   Intra-op Plan:   Post-operative Plan: Extubation in OR  Informed Consent: I have reviewed the patients History and Physical, chart, labs and discussed the procedure including the risks, benefits and alternatives for the proposed anesthesia with the patient or authorized representative who has indicated his/her understanding and acceptance.   Dental advisory given  Plan Discussed with: CRNA, Anesthesiologist and Surgeon  Anesthesia Plan Comments:        Anesthesia Quick Evaluation

## 2013-03-07 NOTE — Anesthesia Procedure Notes (Signed)
Anesthesia Regional Block:  Femoral nerve block  Pre-Anesthetic Checklist: ,, timeout performed, Correct Patient, Correct Site, Correct Laterality, Correct Procedure, Correct Position, site marked, Risks and benefits discussed,  Surgical consent,  Pre-op evaluation,  At surgeon's request and post-op pain management  Laterality: Right  Prep: chloraprep       Needles:  Injection technique: Single-shot  Needle Type: Echogenic Stimulator Needle     Needle Length:cm 9 cm Needle Gauge: 22 and 22 G    Additional Needles:  Procedures: ultrasound guided (picture in chart) Femoral nerve block Narrative:  Start time: 03/07/2013 8:10 AM End time: 03/07/2013 8:15 AM Injection made incrementally with aspirations every 5 mL.  Performed by: Personally   Additional Notes: R adductor canal block 20 cc 0.5% marcaine 1:200 Epi injected easily  Femoral nerve block

## 2013-03-07 NOTE — Evaluation (Addendum)
Occupational Therapy Evaluation Patient Details Name: Patricia Dawson MRN: [id number] DOB: [birth year] Today's Date: 03/07/2013 Time: 9629-5284 OT Time Calculation (min): 15 min  OT Assessment / Plan / Recommendation History of present illness Patient is a 72 yo female s/p Rt partial knee replacement.   Clinical Impression   Patient presents with problems listed below. Session limited today due to nausea. Will benefit from acute OT to maximize independence prior to return home with husband.     OT Assessment  Patient needs continued OT Services    Follow Up Recommendations  Home health OT;Supervision/Assistance - 24 hour    Barriers to Discharge      Equipment Recommendations  None recommended by OT    Recommendations for Other Services    Frequency  Min 2X/week    Precautions / Restrictions Precautions Precautions: Knee;Fall Precaution Comments: Reviewed precautions. Restrictions Weight Bearing Restrictions: Yes RLE Weight Bearing: Weight bearing as tolerated   Pertinent Vitals/Pain Nausea limiting mobility today.    ADL  Eating/Feeding: Performed;Set up;Supervision/safety Where Assessed - Eating/Feeding: Edge of bed Grooming: Set up;Supervision/safety Where Assessed - Grooming: Unsupported sitting Upper Body Bathing: Moderate assistance Where Assessed - Upper Body Bathing: Unsupported sitting Lower Body Bathing: +1 Total assistance Where Assessed - Lower Body Bathing: Unsupported sitting (sitting EOB) Upper Body Dressing: Moderate assistance Where Assessed - Upper Body Dressing: Unsupported sitting Lower Body Dressing: +1 Total assistance Where Assessed - Lower Body Dressing: Unsupported sitting (sitting EOB) Tub/Shower Transfer Method: Not assessed Equipment Used: Other (comment) (oxygen) Transfers/Ambulation Related to ADLs: Did not perform  ADL Comments: Pt overall Total A for LB ADLs. Pt too nauseated to stand and transfer today. Overall Mod A level for UB ADLs  today due to nausea and pt being lethargic.    OT Diagnosis: Acute pain  OT Problem List: Pain;Decreased knowledge of precautions;Decreased knowledge of use of DME or AE;Decreased strength;Decreased range of motion;Decreased activity tolerance OT Treatment Interventions: Self-care/ADL training;DME and/or AE instruction;Therapeutic activities;Patient/family education;Balance training   OT Goals(Current goals can be found in the care plan section) Acute Rehab OT Goals Patient Stated Goal: To return home OT Goal Formulation: With patient Time For Goal Achievement: 03/14/13 Potential to Achieve Goals: Good ADL Goals Pt Will Perform Grooming: with supervision;standing Pt Will Perform Lower Body Bathing: with supervision;sit to/from stand Pt Will Perform Lower Body Dressing: with supervision;sit to/from stand Pt Will Transfer to Toilet: with supervision;ambulating (3 in 1 over commode) Pt Will Perform Toileting - Clothing Manipulation and hygiene: with supervision;sit to/from stand Pt Will Perform Tub/Shower Transfer: Tub transfer;with min guard assist;ambulating (shower equipment tbd) Pt/caregiver will Perform Home Exercise Program: For increased ROM;For increased strengthening;With Supervision, verbal cues required/provided  Visit Information  Last OT Received On: 03/07/13 Assistance Needed: +1 PT/OT Co-Evaluation/Treatment: Yes History of Present Illness: Patient is a 72 yo female s/p Rt partial knee replacement.       Prior Functioning     Home Living Family/patient expects to be discharged to:: Private residence Living Arrangements: Spouse/significant other Available Help at Discharge: Family;Available 24 hours/day Type of Home: House Home Access: Stairs to enter [employer] of Steps: 4 Entrance Stairs-Rails: Left Home Layout: One level Home Equipment: Shower seat;Bedside commode;Walker - 2 wheels Prior Function Level of Independence: Independent with assistive  device(s) (used walker sometimes) Communication Communication: No difficulties Dominant Hand: Right         Vision/Perception     Cognition  Cognition Arousal/Alertness: Lethargic;Suspect due to medications Behavior During Therapy: [facility] for tasks assessed/performed Overall  Cognitive Status: Within Functional Limits for tasks assessed    Extremity/Trunk Assessment Upper Extremity Assessment Upper Extremity Assessment: Overall WFL for tasks assessed     Mobility Bed Mobility Bed Mobility: Supine to Sit;Sitting - Scoot to Edge of Bed;Sit to Supine Supine to Sit: 3: Mod assist;HOB elevated;With rails Sitting - Scoot to Edge of Bed: 4: Min assist;With rail Sit to Supine: 3: Mod assist;HOB elevated Details for Bed Mobility Assistance: Verbal cues for technique.  Assist to move RLE off of bed and to raise trunk to sitting position.  Patient sat EOB x 8 minutes with good sitting balance.  Patient with nausea - unable to continue.  Returned to supine with mod assist to raise LE's onto bed. Transfers Transfers: Not assessed        Balance Balance Balance Assessed: Yes Static Sitting Balance Static Sitting - Balance Support: No upper extremity supported;Feet supported Static Sitting - Level of Assistance: 5: Stand by assistance Static Sitting - Comment/# of Minutes: 8   End of Session OT - End of Session Equipment Utilized During Treatment: Oxygen Activity Tolerance: Other (comment) (nauseated) Patient left: in bed;with call bell/phone within reach;with family/visitor present CPM Right Knee CPM Right Knee: Off  GO     Earlie Raveling OTR/L [phone] 03/07/2013, 5:15 PM

## 2013-03-08 LAB — GLUCOSE, CAPILLARY: Glucose-Capillary: 153 mg/dL — ABNORMAL HIGH (ref 70–99)

## 2013-03-08 LAB — BASIC METABOLIC PANEL
BUN: 10 mg/dL (ref 6–23)
Calcium: 9.2 mg/dL (ref 8.4–10.5)
Creatinine, Ser: 0.52 mg/dL (ref 0.50–1.10)
GFR calc Af Amer: >90 mL/min (ref >90)
GFR calc non Af Amer: >90 mL/min (ref >90)

## 2013-03-08 LAB — CBC
HCT: 37.1 % (ref 36.0–46.0)
MCHC: 32.6 g/dL (ref 30.0–36.0)
MCV: 85.5 fL (ref 78.0–100.0)
Platelets: 201 10*3/uL (ref 150–400)
RDW: 14.7 % (ref 11.5–15.5)

## 2013-03-08 MED ORDER — ENOXAPARIN SODIUM 40 MG/0.4ML ~~LOC~~ SOLN: 40.0000 mg | SUBCUTANEOUS | Status: DC

## 2013-03-08 MED ORDER — METHOCARBAMOL 500 MG PO TABS: 500.0000 mg | ORAL_TABLET | Freq: Four times a day (QID) | ORAL | Status: DC | PRN

## 2013-03-08 MED ORDER — HYDROCODONE-ACETAMINOPHEN 7.5-325 MG PO TABS: 1.0000 | ORAL_TABLET | ORAL | Status: DC | PRN

## 2013-03-08 MED ORDER — CELECOXIB 200 MG PO CAPS: 200.0000 mg | ORAL_CAPSULE | Freq: Two times a day (BID) | ORAL | Status: DC

## 2013-03-08 NOTE — Progress Notes (Signed)
   CARE MANAGEMENT NOTE 03/08/2013  Patient:  Patricia Dawson, Patricia Dawson   Account Number:  [id number]  Date Initiated:  03/07/2013  Documentation initiated by:  [facility]  Subjective/Objective Assessment:     Action/Plan:   Anticipated DC Date:  03/08/2013   Anticipated DC Plan:  HOME W HOME HEALTH SERVICES      DC Planning Services  CM consult      [facility] Choice  HOME HEALTH  DURABLE MEDICAL EQUIPMENT   Choice offered to / List presented to:  C-1 Patient   DME arranged  CPM      DME agency  [employer]     HH arranged  HH-2 PT      [facility] agency  [facility]   Status of service:  Completed, signed off Medicare Important Message given?   (If response is "NO", the following Medicare IM given date fields will be blank) Date Medicare IM given:   Date Additional Medicare IM given:    Discharge Disposition:  HOME W HOME HEALTH SERVICES  Per UR Regulation:    If discussed at Long Length of Stay Meetings, dates discussed:    Comments:  03-08-13 11:20  Met with pt and pt's husband who met with [place] rep and they state they have no further questions or concerns.  Freddy Jaksch, BSN, [facility]aryl Ada [phone].  03/07/2013 1500 Received call from [place] and they will be following for [facility]. Waiting PT/OT recommendations. Isidoro Donning RN CCM Case Mgmt phone [phone]

## 2013-03-08 NOTE — Progress Notes (Addendum)
Occupational Therapy Treatment Patient Details Name: Patricia Dawson MRN: [id number] DOB: July 07, 1941 Today's Date: 03/08/2013 Time: 1478-2956 OT Time Calculation (min): 25 min  OT Assessment / Plan / Recommendation  History of present illness Patient is a 72 yo female s/p Rt partial knee replacement.   OT comments  Practiced LB dressing and toilet transfer. Educated on tub shower techniques and will practice tomorrow.   Follow Up Recommendations  No OT follow up;Supervision/Assistance - 24 hour    Barriers to Discharge       Equipment Recommendations  None recommended by OT    Recommendations for Other Services    Frequency Min 2X/week   Progress towards OT Goals Progress towards OT goals: Progressing toward goals  Plan Discharge plan needs to be updated    Precautions / Restrictions Precautions Precautions: Knee;Fall Precaution Comments: Reviewed precautions. Restrictions Weight Bearing Restrictions: Yes RLE Weight Bearing: Weight bearing as tolerated   Pertinent Vitals/Pain Pain in knee but not rated. Increased activity.     ADL  Lower Body Dressing: Minimal assistance Where Assessed - Lower Body Dressing: Supported sit to stand Toilet Transfer: Performed;Min guard Statistician Method: Sit to Barista: Raised toilet seat with arms (or 3-in-1 over toilet) Equipment Used: Rolling walker Transfers/Ambulation Related to ADLs: Minguard level. Pt ambulated in hallway. ADL Comments: Pt practiced toilet transfer. OT educated on reaching to don/doff sock to increase ROM in knee. Pt required assistance to don sock, but pt able to doff it. Pt also donned underwear and was at Minguard level. OT educated on tub transfer techniques. Pt has shower chair at home. OT demonstrated and pt will practice it tomorrow.    OT Diagnosis:    OT Problem List:   OT Treatment Interventions:     OT Goals(current goals can now be found in the care plan section) Acute  Rehab OT Goals Patient Stated Goal: To return home OT Goal Formulation: With patient Time For Goal Achievement: 03/14/13 Potential to Achieve Goals: Good ADL Goals Pt Will Perform Grooming: with supervision;standing Pt Will Perform Lower Body Bathing: with supervision;sit to/from stand Pt Will Perform Lower Body Dressing: with supervision;sit to/from stand Pt Will Transfer to Toilet: with supervision;ambulating (3 in 1 over commode) Pt Will Perform Toileting - Clothing Manipulation and hygiene: with supervision;sit to/from stand Pt Will Perform Tub/Shower Transfer: Tub transfer;with min guard assist;ambulating (shower equipment tbd)  Visit Information  Last OT Received On: 03/08/13 Assistance Needed: +1 PT/OT Co-Evaluation/Treatment: Yes History of Present Illness: Patient is a 72 yo female s/p Rt partial knee replacement.    Subjective Data      Prior Functioning       Cognition  Cognition Arousal/Alertness: Awake/alert Behavior During Therapy: WFL for tasks assessed/performed Overall Cognitive Status: Within Functional Limits for tasks assessed    Mobility  Bed Mobility Bed Mobility: Not assessed Transfers Transfers: Sit to Stand;Stand to Sit Sit to Stand: 4: Min guard;With armrests;From chair/3-in-1 Stand to Sit: 4: Min guard;With upper extremity assist;With armrests;To chair/3-in-1 Details for Transfer Assistance: Cues for technique and hand placement.        Balance     End of Session OT - End of Session Equipment Utilized During Treatment: Rolling walker Activity Tolerance: Patient tolerated treatment well Patient left: in chair;with call bell/phone within reach;with family/visitor present  GO     Earlie Raveling OTR/L [phone] 03/08/2013, 12:10 PM

## 2013-03-08 NOTE — Progress Notes (Signed)
Physical Therapy Treatment Patient Details Name: Patricia Dawson MRN: [id number] DOB: March 09, 1941 Today's Date: 03/08/2013 Time: 4098-1191 PT Time Calculation (min): 27 min  PT Assessment / Plan / Recommendation  History of Present Illness Patient is a 72 yo female s/p Rt partial knee replacement.   PT Comments   Patient progessing with mobility this morning. Not having any dizziness or nausea. Will attempt to increase ambulation this afternoon.   Follow Up Recommendations  Home health PT;Supervision/Assistance - 24 hour     Does the patient have the potential to tolerate intense rehabilitation     Barriers to Discharge        Equipment Recommendations  None recommended by PT    Recommendations for Other Services    Frequency 7X/week   Progress towards PT Goals Progress towards PT goals: Progressing toward goals  Plan Current plan remains appropriate    Precautions / Restrictions Precautions Precautions: Knee;Fall Precaution Comments: Reviewed precautions. Restrictions Weight Bearing Restrictions: Yes RLE Weight Bearing: Weight bearing as tolerated   Pertinent Vitals/Pain 6/10 R knee pain. RN provided medication to assist with pain control     Mobility  Bed Mobility Bed Mobility: Not assessed Transfers Transfers: Sit to Stand;Stand to Sit Sit to Stand: 4: Min guard;With armrests;From chair/3-in-1 Stand to Sit: With armrests;To chair/3-in-1 Details for Transfer Assistance: Cues for technique and hand placement.  Ambulation/Gait Ambulation/Gait Assistance: 4: Min guard Ambulation Distance (Feet): 80 Feet Assistive device: Rolling walker Ambulation/Gait Assistance Details: Cues for WBAT, sequency and RW management Gait Pattern: Step-to pattern    Exercises Total Joint Exercises Quad Sets: AROM;Right;10 reps Heel Slides: AAROM;Right;10 reps Hip ABduction/ADduction: AAROM;Right;10 reps Straight Leg Raises: AAROM;Right;10 reps Long Arc Quad: AROM;Right;10 reps   PT  Diagnosis:    PT Problem List:   PT Treatment Interventions:     PT Goals (current goals can now be found in the care plan section)    Visit Information  Last PT Received On: 03/08/13 Assistance Needed: +1 History of Present Illness: Patient is a 72 yo female s/p Rt partial knee replacement.    Subjective Data      Cognition  Cognition Arousal/Alertness: Awake/alert Behavior During Therapy: WFL for tasks assessed/performed Overall Cognitive Status: Within Functional Limits for tasks assessed    Balance     End of Session PT - End of Session Equipment Utilized During Treatment: Gait belt Activity Tolerance: Patient tolerated treatment well Patient left: in chair;with call bell/phone within reach Nurse Communication: Mobility status CPM Right Knee CPM Right Knee: Off   GP     Fredrich Birks 03/08/2013, 11:52 AM  03/08/2013 Fredrich Birks PTA [phone] pager [phone] office

## 2013-03-08 NOTE — Progress Notes (Signed)
SPORTS MEDICINE AND JOINT REPLACEMENT  Georgena Spurling, MD   Altamese Cabal, PA-C [address] [place], [place], [place]  [address]                             [phone]   PROGRESS NOTE  Subjective:  negative for Chest Pain  negative for Shortness of Breath  negative for Nausea/Vomiting   negative for Calf Pain  negative for Bowel Movement   Tolerating Diet: yes         Patient reports pain as 7 on 0-10 scale.    Objective: Vital signs in last 24 hours:   Patient Vitals for the past 24 hrs:  BP Temp Temp src Pulse Resp SpO2  03/08/13 0953 150/66 mmHg - - - - -  03/08/13 0909 - - - - - 96 %  03/08/13 0624 150/66 mmHg 98.4 F (36.9 C) Oral 88 16 99 %  03/08/13 0400 - - - - 18 94 %  03/08/13 0058 - - - - 18 95 %  03/07/13 2140 166/74 mmHg 97.6 F (36.4 C) Oral 73 16 97 %  03/07/13 2136 - - - - - 96 %  03/07/13 1600 - - - - 16 -  03/07/13 1400 158/82 mmHg 98.4 F (36.9 C) - - 16 95 %    @flow {1959:LAST@   Intake/Output from previous day:   08/04 0701 - 08/05 0700 In: 1470 [P.O.:120; I.V.:1350] Out: 850 [Urine:800]   Intake/Output this shift:       Intake/Output     08/04 0701 - 08/05 0700 08/05 0701 - 08/06 0700   P.O. 120    I.V. 1350    Total Intake 1470     Urine 800    Blood 50    Total Output 850     Net +620          Urine Occurrence 1 x       LABORATORY DATA:  Recent Labs  03/07/13 1535 03/08/13 0500  WBC 12.6* 12.2*  HGB 12.8 12.1  HCT 38.0 37.1  PLT 190 201    Recent Labs  03/07/13 1535 03/08/13 0500  NA  --  133*  K  --  3.4*  CL  --  96  CO2  --  28  BUN  --  10  CREATININE 0.54 0.52  GLUCOSE  --  139*  CALCIUM  --  9.2   Lab Results  Component Value Date   INR 1.06 02/22/2013    Examination:  General appearance: alert, appears stated age and no distress Extremities: Homans sign is negative, no sign of DVT  Wound Exam: clean, dry, intact   Drainage:  Scant/small amount Serosanguinous exudate  Motor Exam: EHL and  FHL Intact  Sensory Exam: Deep Peroneal normal   Assessment:    1 Day Post-Op  Procedure(s) (LRB): MEDIAL PARTIAL KNEE REPLACEMENT (Right)  ADDITIONAL DIAGNOSIS:  Active Problems:   * No active hospital problems. *  Acute Blood Loss Anemia   Plan: Physical Therapy as ordered Weight Bearing as Tolerated (WBAT)  DVT Prophylaxis:  Lovenox  DISCHARGE PLAN: Home  DISCHARGE NEEDS: HHPT, CPM, Walker and 3-in-1 comode seat         [place] Lebeau 03/08/2013, 1:20 PM

## 2013-03-08 NOTE — Progress Notes (Signed)
Physical Therapy Treatment Patient Details Name: Patricia Dawson MRN: [id number] DOB: 28-Aug-1940 Today's Date: 03/08/2013 Time: 4098-1191 PT Time Calculation (min): 26 min  PT Assessment / Plan / Recommendation  History of Present Illness Patient is a 72 yo female s/p Rt partial knee replacement.   PT Comments   Patient progressing okay this afternoon. Somewhat limited by pain. Will attempt stair training tomorrow  Follow Up Recommendations  Home health PT;Supervision/Assistance - 24 hour     Does the patient have the potential to tolerate intense rehabilitation     Barriers to Discharge        Equipment Recommendations  None recommended by PT    Recommendations for Other Services    Frequency 7X/week   Progress towards PT Goals Progress towards PT goals: Progressing toward goals  Plan Current plan remains appropriate    Precautions / Restrictions Precautions Precautions: Knee;Fall Precaution Comments: Reviewed precautions. Restrictions Weight Bearing Restrictions: Yes RLE Weight Bearing: Weight bearing as tolerated   Pertinent Vitals/Pain 8/10 R knee pain   Mobility  Bed Mobility Bed Mobility: Not assessed Transfers Transfers: Sit to Stand;Stand to Sit Sit to Stand: 4: Min guard;With armrests;From chair/3-in-1;With upper extremity assist Stand to Sit: 4: Min guard;With upper extremity assist;With armrests;To chair/3-in-1 Details for Transfer Assistance: Cues for technique and hand placement. Patient stood x3  Ambulation/Gait Ambulation/Gait Assistance: 4: Min Government social research officer (Feet): 60 Feet Assistive device: Rolling walker Ambulation/Gait Assistance Details: Increased pain with ambulation this session. Cues for posture  Gait Pattern: Step-to pattern    Exercises Total Joint Exercises Quad Sets: AROM;Right;10 reps Heel Slides: AAROM;Right;10 reps Hip ABduction/ADduction: AAROM;Right;10 reps Straight Leg Raises: AAROM;Right;10 reps Long Arc Quad:  AROM;Right;10 reps   PT Diagnosis:    PT Problem List:   PT Treatment Interventions:     PT Goals (current goals can now be found in the care plan section) Acute Rehab PT Goals Patient Stated Goal: To return home  Visit Information  Last PT Received On: 03/08/13 Assistance Needed: +1 History of Present Illness: Patient is a 72 yo female s/p Rt partial knee replacement.    Subjective Data  Patient Stated Goal: To return home   Cognition  Cognition Arousal/Alertness: Awake/alert Behavior During Therapy: WFL for tasks assessed/performed Overall Cognitive Status: Within Functional Limits for tasks assessed    Balance     End of Session PT - End of Session Equipment Utilized During Treatment: Gait belt Activity Tolerance: Patient tolerated treatment well Patient left: in chair;with call bell/phone within reach Nurse Communication: Mobility status   GP     Fredrich Birks 03/08/2013, 2:34 PM .sing

## 2013-03-09 LAB — CBC
MCHC: 32.7 g/dL (ref 30.0–36.0)
MCV: 85.7 fL (ref 78.0–100.0)
Platelets: 192 10*3/uL (ref 150–400)
RDW: 14.7 % (ref 11.5–15.5)
WBC: 9.3 10*3/uL (ref 4.0–10.5)

## 2013-03-09 LAB — GLUCOSE, CAPILLARY
Glucose-Capillary: 124 mg/dL — ABNORMAL HIGH (ref 70–99)
Glucose-Capillary: 151 mg/dL — ABNORMAL HIGH (ref 70–99)

## 2013-03-09 NOTE — Progress Notes (Signed)
Physical Therapy Treatment Patient Details Name: Kesleigh Morson MRN: [id number] DOB: 1941/03/04 Today's Date: 03/09/2013 Time: 9528-4132 PT Time Calculation (min): 24 min  PT Assessment / Plan / Recommendation  History of Present Illness Patient is a 72 yo female s/p Rt partial knee replacement.   PT Comments   Patient progressing very well this morning. Able to complete stair training and husband present for instruction. Anticipate DC today  Follow Up Recommendations  Home health PT;Supervision/Assistance - 24 hour     Does the patient have the potential to tolerate intense rehabilitation     Barriers to Discharge        Equipment Recommendations  None recommended by PT    Recommendations for Other Services    Frequency 7X/week   Progress towards PT Goals Progress towards PT goals: Progressing toward goals  Plan Current plan remains appropriate    Precautions / Restrictions Precautions Precautions: Knee;Fall Restrictions RLE Weight Bearing: Weight bearing as tolerated   Pertinent Vitals/Pain 5/10 R knee pain. patient repositioned for comfort    Mobility  Bed Mobility Bed Mobility: Not assessed Transfers Sit to Stand: 5: Supervision Stand to Sit: 5: Supervision Ambulation/Gait Ambulation/Gait Assistance: 5: Supervision Ambulation Distance (Feet): 115 Feet Assistive device: Rolling walker Ambulation/Gait Assistance Details: Cues for posture and RW positioning Gait Pattern: Step-to pattern Stairs: Yes Stairs Assistance: 4: Min assist Stairs Assistance Details (indicate cue type and reason): HHA on R for stability. Cues for technique Stair Management Technique: Forwards;One rail Left Number of Stairs: 3    Exercises Total Joint Exercises Ankle Circles/Pumps: AROM;Both;10 reps;Seated Quad Sets: AROM;Right;10 reps Heel Slides: Right;10 reps;AROM Hip ABduction/ADduction: Right;10 reps;AROM Straight Leg Raises: AAROM;Right;10 reps Long Arc Quad: AROM;Right;10 reps    PT Diagnosis:    PT Problem List:   PT Treatment Interventions:     PT Goals (current goals can now be found in the care plan section)    Visit Information  Last PT Received On: 03/09/13 Assistance Needed: +1 History of Present Illness: Patient is a 72 yo female s/p Rt partial knee replacement.    Subjective Data      Cognition  Cognition Arousal/Alertness: Awake/alert Behavior During Therapy: WFL for tasks assessed/performed Overall Cognitive Status: Within Functional Limits for tasks assessed    Balance     End of Session PT - End of Session Equipment Utilized During Treatment: Gait belt Activity Tolerance: Patient tolerated treatment well Patient left: in chair;with call bell/phone within reach Nurse Communication: Mobility status   GP     Fredrich Birks 03/09/2013, 8:47 AM  03/09/2013 Fredrich Birks PTA [phone] pager [phone] office

## 2013-03-09 NOTE — Progress Notes (Addendum)
Occupational Therapy Treatment Patient Details Name: Patricia Dawson MRN: [id number] DOB: [birth year] Today's Date: 03/09/2013 Time: 8295-6213 OT Time Calculation (min): 16 min  OT Assessment / Plan / Recommendation  History of present illness Patient is a 72 yo female s/p Rt partial knee replacement.   OT comments  Practiced tub transfer and husband present for this. Pt performed UB/LB dressing, grooming, and toileting tasks. Feel pt is safe to d/c home with husband available to assist.   Follow Up Recommendations  No OT follow up;Supervision/Assistance - 24 hour    Barriers to Discharge       Equipment Recommendations  None recommended by OT    Recommendations for Other Services    Frequency Min 2X/week   Progress towards OT Goals Progress towards OT goals: Progressing toward goals  Plan Discharge plan remains appropriate    Precautions / Restrictions Precautions Precautions: Knee;Fall Precaution Comments: Reviewed precautions. Restrictions Weight Bearing Restrictions: Yes RLE Weight Bearing: Weight bearing as tolerated   Pertinent Vitals/Pain Pain 4/10. Increased activity.     ADL  Grooming: Performed;Wash/dry hands;Brushing hair;Supervision/safety;Set up (brushed hair-setup and washed hands-supervision) Where Assessed - Grooming: Supported sitting;Unsupported standing Upper Body Dressing: Set up Where Assessed - Upper Body Dressing: Unsupported sitting Lower Body Dressing: Performed;Minimal assistance Where Assessed - Lower Body Dressing: Supported sit to stand Toilet Transfer: Performed;Min guard Statistician Method: Sit to Barista: Raised toilet seat with arms (or 3-in-1 over toilet) Toileting - Clothing Manipulation and Hygiene: Min guard;Supervision/safety (clothing-minguard and hygiene-supervision) Where Assessed - Toileting Clothing Manipulation and Hygiene: Sit on 3-in-1 or toilet;Sit to stand from 3-in-1 or toilet (hygiene-sitting and  clothing-sit to stand) Tub/Shower Transfer: Performed;Minimal assistance Tub/Shower Transfer Method: Science writer: Shower seat with back Equipment Used: Gait belt;Rolling walker Transfers/Ambulation Related to ADLs: Minguard for ambulation and Minguard/supervision for transfers. ADL Comments: Pt practiced tub transfer and at Min A level to assist RLE-cues for technique. Pt performed UB/LB dressing and Min A for LB dressing to help don right shoe. Cues to be sure to not twist knee during session. Pt did well during session.    OT Diagnosis:    OT Problem List:   OT Treatment Interventions:     OT Goals(current goals can now be found in the care plan section) Acute Rehab OT Goals Patient Stated Goal: not stated OT Goal Formulation: With patient Time For Goal Achievement: 03/14/13 Potential to Achieve Goals: Good ADL Goals Pt Will Perform Grooming: with supervision;standing Pt Will Perform Lower Body Bathing: with supervision;sit to/from stand Pt Will Perform Lower Body Dressing: with supervision;sit to/from stand Pt Will Transfer to Toilet: with supervision;ambulating (3 in 1 over commode) Pt Will Perform Toileting - Clothing Manipulation and hygiene: with supervision;sit to/from stand Pt Will Perform Tub/Shower Transfer: Tub transfer;with min guard assist;ambulating (shower equipment tbd)  Visit Information  Last OT Received On: 03/09/13 Assistance Needed: +1 History of Present Illness: Patient is a 72 yo female s/p Rt partial knee replacement.    Subjective Data      Prior Functioning       Cognition  Cognition Arousal/Alertness: Awake/alert Behavior During Therapy: WFL for tasks assessed/performed Overall Cognitive Status: Within Functional Limits for tasks assessed    Mobility  Bed Mobility Bed Mobility: Supine to Sit Supine to Sit: 4: Min guard;HOB flat Transfers Transfers: Sit to Stand;Stand to Sit Sit to Stand: 4: Min guard;With upper  extremity assist;From bed;From chair/3-in-1 Stand to Sit: 5: Supervision;With upper extremity assist;To chair/3-in-1 Details for  Transfer Assistance: Pt slow to stand.  Cues for hand placement.      Balance     End of Session OT - End of Session Equipment Utilized During Treatment: Gait belt;Rolling walker Activity Tolerance: Patient tolerated treatment well Patient left: Other (comment) (with PT )   GO     Earlie Raveling OTR/L [phone] 03/09/2013, 10:51 AM

## 2013-03-10 ENCOUNTER — Encounter (HOSPITAL_COMMUNITY): Payer: Self-pay | Admitting: Orthopedic Surgery

## 2013-03-10 DIAGNOSIS — E119 Type 2 diabetes mellitus without complications: Secondary | ICD-10-CM | POA: Diagnosis not present

## 2013-03-10 DIAGNOSIS — I2510 Atherosclerotic heart disease of native coronary artery without angina pectoris: Secondary | ICD-10-CM | POA: Diagnosis not present

## 2013-03-10 DIAGNOSIS — IMO0001 Reserved for inherently not codable concepts without codable children: Secondary | ICD-10-CM | POA: Diagnosis not present

## 2013-03-10 DIAGNOSIS — Z471 Aftercare following joint replacement surgery: Secondary | ICD-10-CM | POA: Diagnosis not present

## 2013-03-10 DIAGNOSIS — Z96659 Presence of unspecified artificial knee joint: Secondary | ICD-10-CM | POA: Diagnosis not present

## 2013-03-10 NOTE — Discharge Summary (Signed)
SPORTS MEDICINE & JOINT REPLACEMENT   Georgena Spurling, MD   Altamese Cabal, PA-C [address] [place], [place], [place]  [address]                             [phone]  PATIENT ID: Patricia Dawson        MRN:  [id number]          DOB/AGE: 1941-06-17 / 72 y.o.    DISCHARGE SUMMARY  ADMISSION DATE:    03/07/2013 DISCHARGE DATE:   03/10/2013   ADMISSION DIAGNOSIS: osteoarthritis right knee medial compartment    DISCHARGE DIAGNOSIS:  osteoarthritis right knee medial compartment    ADDITIONAL DIAGNOSIS: Active Problems:   * No active hospital problems. *  Past Medical History  Diagnosis Date  . PONV (postoperative nausea and vomiting)   . Hypertension   . GERD (gastroesophageal reflux disease)   . Hyperlipemia   . Coronary artery disease   . OSA (obstructive sleep apnea)     "tried off and on for several years to wear mask; I can't" (03/07/2013)  . Type II diabetes mellitus   . Sinus headache     "often" (03/07/2013)  . Arthritis     "right knee"  (03/07/2013)    PROCEDURE: Procedure(s): MEDIAL PARTIAL KNEE REPLACEMENT on 03/07/2013  CONSULTS:     HISTORY:  See H&P in chart  HOSPITAL COURSE:  [name] is a 72 y.o. admitted on 03/07/2013 and found to have a diagnosis of osteoarthritis right knee medial compartment.  After appropriate laboratory studies were obtained  they were taken to the operating room on 03/07/2013 and underwent Procedure(s): MEDIAL PARTIAL KNEE REPLACEMENT.   They were given perioperative antibiotics:  Anti-infectives   Start     Dose/Rate Route Frequency Ordered Stop   03/07/13 1600  ceFAZolin (ANCEF) IVPB 1 g/50 mL premix     1 g 100 mL/hr over 30 Minutes Intravenous Every 6 hours 03/07/13 1414 03/08/13 0130   03/07/13 0600  ceFAZolin (ANCEF) IVPB 2 g/50 mL premix     2 g 100 mL/hr over 30 Minutes Intravenous On call to O.R. 03/06/13 1329 03/07/13 0906    .  Tolerated the procedure well.  Placed with a foley intraoperatively.  Given Ofirmev at  induction and for 48 hours.    POD# 1: Vital signs were stable.  Patient denied Chest pain, shortness of breath, or calf pain.  Patient was started on Lovenox 30 mg subcutaneously twice daily at 8am.  Consults to PT, OT, and care management were made.  The patient was weight bearing as tolerated.  CPM was placed on the operative leg 0-90 degrees for 6-8 hours a day.  Incentive spirometry was taught.  Dressing was changed.  Marcaine pump and hemovac were discontinued.      POD #2, Continued  PT for ambulation and exercise program.  IV saline locked.  O2 discontinued.    The remainder of the hospital course was dedicated to ambulation and strengthening.   The patient was discharged on 3 Days Post-Op in  Good condition.  Blood products given:none  DIAGNOSTIC STUDIES: Recent vital signs: No data found.      Recent laboratory studies:  Recent Labs  03/07/13 1535 03/08/13 0500 03/09/13 0500  WBC 12.6* 12.2* 9.3  HGB 12.8 12.1 11.4*  HCT 38.0 37.1 34.9*  PLT 190 201 192    Recent Labs  03/07/13 1535 03/08/13 0500  NA  --  133*  K  --  3.4*  CL  --  96  CO2  --  28  BUN  --  10  CREATININE 0.54 0.52  GLUCOSE  --  139*  CALCIUM  --  9.2   Lab Results  Component Value Date   INR 1.06 02/22/2013     Recent Radiographic Studies :  Dg Chest 2 View  02/22/2013   *RADIOLOGY REPORT*  Clinical Data: Preop for knee replacement  CHEST - 2 VIEW  Comparison: None.  Findings: Lateral view degraded by patient arm position.  Prior median sternotomy.  Surgical clips in the left axilla. Midline trachea.  The proximal most median sternotomy wires are fractured. Borderline cardiomegaly.     Mediastinal contours otherwise within normal limits.  No pleural effusion or pneumothorax.  Mildly low lung volumes.  IMPRESSION: Borderline cardiomegaly, without acute disease.   Original Report Authenticated By: Jeronimo Greaves, M.D.   Dg C-arm 1-60 Min-no Report  03/07/2013   CLINICAL DATA: Unicompartmental  knee repalcement   C-ARM 1-60 MINUTES  Fluoroscopy was utilized by the requesting physician.  No radiographic  interpretation.     DISCHARGE INSTRUCTIONS: Discharge Orders   Future Orders Complete By Expires     CPM  As directed     Comments:      Continuous passive motion machine (CPM):      Use the CPM from 0 to 90 for 6-8 hours per day.      You may increase by 10 per day.  You may break it up into 2 or 3 sessions per day.      Use CPM for 2 weeks or until you are told to stop.    Call MD / Call 911  As directed     Comments:      If you experience chest pain or shortness of breath, CALL 911 and be transported to the hospital emergency room.  If you develope a fever above 101 F, pus (white drainage) or increased drainage or redness at the wound, or calf pain, call your surgeon's office.    Change dressing  As directed     Comments:      Change dressing on Thursday, then change the dressing daily with sterile 4 x 4 inch gauze dressing and apply TED hose.    Constipation Prevention  As directed     Comments:      Drink plenty of fluids.  Prune juice may be helpful.  You may use a stool softener, such as Colace (over the counter) 100 mg twice a day.  Use MiraLax (over the counter) for constipation as needed.    Diet - low sodium heart healthy  As directed     Do not put a pillow under the knee. Place it under the heel.  As directed     Driving restrictions  As directed     Comments:      No driving for 6 weeks    Increase activity slowly as tolerated  As directed     Lifting restrictions  As directed     Comments:      No lifting for 6 weeks    TED hose  As directed     Comments:      Use stockings (TED hose) for 3 weeks on both leg(s).  You may remove them at night for sleeping.       DISCHARGE MEDICATIONS:     Medication List    STOP taking these  medications       aspirin EC 81 MG tablet     ibuprofen 200 MG tablet  Commonly known as:  ADVIL,MOTRIN      TAKE these  medications       beclomethasone 40 MCG/ACT inhaler  Commonly known as:  QVAR  Inhale 2 puffs into the lungs daily as needed (shortness of breath/wheezing).     celecoxib 200 MG capsule  Commonly known as:  CELEBREX  Take 1 capsule (200 mg total) by mouth every 12 (twelve) hours.     cholecalciferol 1000 UNITS tablet  Commonly known as:  VITAMIN D  Take 1,000 Units by mouth daily.     dextromethorphan-guaiFENesin 30-600 MG per 12 hr tablet  Commonly known as:  MUCINEX DM  Take 1 tablet by mouth 2 (two) times daily as needed (congestion).     enoxaparin 40 MG/0.4ML injection  Commonly known as:  LOVENOX  Inject 0.4 mLs (40 mg total) into the skin daily.     HYDROcodone-acetaminophen 7.5-325 MG per tablet  Commonly known as:  NORCO  Take 1-2 tablets by mouth every 4 (four) hours as needed.     lisinopril-hydrochlorothiazide 20-25 MG per tablet  Commonly known as:  PRINZIDE,ZESTORETIC  Take 1 tablet by mouth daily.     metFORMIN 500 MG 24 hr tablet  Commonly known as:  GLUCOPHAGE-XR  Take 500 mg by mouth 2 (two) times daily.     methocarbamol 500 MG tablet  Commonly known as:  ROBAXIN  Take 1-2 tablets (500-1,000 mg total) by mouth every 6 (six) hours as needed.     omeprazole 20 MG capsule  Commonly known as:  PRILOSEC  Take 20 mg by mouth 2 (two) times daily.     simvastatin 40 MG tablet  Commonly known as:  ZOCOR  Take 40 mg by mouth at bedtime.        FOLLOW UP VISIT:       Follow-up Information   Follow up with [facility]. (Home Health Physical Therapy)    Contact information:   [phone]      Follow up with Raymon Mutton, MD. Call on 03/22/2013.   Contact information:   [address] [place] [address] [phone]       DISPOSITION: HOME  CONDITION:  Good   Leodis Alcocer 03/10/2013, 3:57 PM

## 2013-03-11 DIAGNOSIS — I2510 Atherosclerotic heart disease of native coronary artery without angina pectoris: Secondary | ICD-10-CM | POA: Diagnosis not present

## 2013-03-11 DIAGNOSIS — IMO0001 Reserved for inherently not codable concepts without codable children: Secondary | ICD-10-CM | POA: Diagnosis not present

## 2013-03-11 DIAGNOSIS — Z96659 Presence of unspecified artificial knee joint: Secondary | ICD-10-CM | POA: Diagnosis not present

## 2013-03-11 DIAGNOSIS — Z471 Aftercare following joint replacement surgery: Secondary | ICD-10-CM | POA: Diagnosis not present

## 2013-03-11 DIAGNOSIS — E119 Type 2 diabetes mellitus without complications: Secondary | ICD-10-CM | POA: Diagnosis not present

## 2013-03-11 NOTE — Op Note (Signed)
dictitation number: [id number]

## 2013-03-14 DIAGNOSIS — I2510 Atherosclerotic heart disease of native coronary artery without angina pectoris: Secondary | ICD-10-CM | POA: Diagnosis not present

## 2013-03-14 DIAGNOSIS — Z96659 Presence of unspecified artificial knee joint: Secondary | ICD-10-CM | POA: Diagnosis not present

## 2013-03-14 DIAGNOSIS — Z471 Aftercare following joint replacement surgery: Secondary | ICD-10-CM | POA: Diagnosis not present

## 2013-03-14 DIAGNOSIS — IMO0001 Reserved for inherently not codable concepts without codable children: Secondary | ICD-10-CM | POA: Diagnosis not present

## 2013-03-14 DIAGNOSIS — E119 Type 2 diabetes mellitus without complications: Secondary | ICD-10-CM | POA: Diagnosis not present

## 2013-03-14 NOTE — Op Note (Signed)
NAMEETHELINE, GEPPERT                    ACCOUNT NO.:  [id number]  MEDICAL RECORD NO.:  [id number]  LOCATION:                               FACILITY:  [facility]  PHYSICIAN:  Mila Homer. Sherlean Foot, M.D. DATE OF BIRTH:  1941/06/29  DATE OF PROCEDURE:  03/11/2013 DATE OF DISCHARGE:  03/11/2013                              OPERATIVE REPORT   __________  ANESTHESIA:  General.  __________ prepped and draped in the usual sterile fashion.  Midline incision was made about 4 inches in length, centered up at the patella down to the tibial tubercle.  Clean blade was used to make a median parapatellar arthrotomy and to remove the medial fat pad.  I reflected the deep MCL off the medial crest of the tibia with a #10 blade.  I then used the __________ device and the tibia alignment guide under C-arm imaging to pin the cutting block at the tibia and femur in place, checking for the mechanical axis.  I then used the sagittal saw to make the distal femoral and proximal tibial cuts.  I then removed the __________ device, checked on x-ray to make sure that they were adequate.  I went into flexion.  I sized the femur to a D, pinned that into place and made the chamfer cuts and lug holes.  I then trialed the D, fit very nicely.  I then created a trough for this to receive tibial component.  I then templated to a size 1 with the trial in place, drilled the lug holes, and then trialed 8, 9, and 10 polys and chose a 9 mm poly.  I then removed the trial components and copiously irrigated. I then cemented down the components removing all excess cement, snapped in the 9 mm polyethylene, and allowed the cement to harden in extension. I then copiously irrigated.  I closed the arthrotomy with #1 Vicryl figure-of-eight sutures.  Deep soft tissues with 0 Vicryl sutures. Xeroform dressing and sponges __________.  COMPLICATIONS:  None.  DRAINS:  None.          ______________________________ Mila Homer. Sherlean Foot,  M.D.     SDL/MEDQ  D:  03/11/2013  T:  03/11/2013  Job:  [id number]

## 2013-03-15 DIAGNOSIS — I2510 Atherosclerotic heart disease of native coronary artery without angina pectoris: Secondary | ICD-10-CM | POA: Diagnosis not present

## 2013-03-15 DIAGNOSIS — Z471 Aftercare following joint replacement surgery: Secondary | ICD-10-CM | POA: Diagnosis not present

## 2013-03-15 DIAGNOSIS — E119 Type 2 diabetes mellitus without complications: Secondary | ICD-10-CM | POA: Diagnosis not present

## 2013-03-15 DIAGNOSIS — Z96659 Presence of unspecified artificial knee joint: Secondary | ICD-10-CM | POA: Diagnosis not present

## 2013-03-15 DIAGNOSIS — IMO0001 Reserved for inherently not codable concepts without codable children: Secondary | ICD-10-CM | POA: Diagnosis not present

## 2013-03-16 DIAGNOSIS — Z471 Aftercare following joint replacement surgery: Secondary | ICD-10-CM | POA: Diagnosis not present

## 2013-03-16 DIAGNOSIS — IMO0001 Reserved for inherently not codable concepts without codable children: Secondary | ICD-10-CM | POA: Diagnosis not present

## 2013-03-16 DIAGNOSIS — Z96659 Presence of unspecified artificial knee joint: Secondary | ICD-10-CM | POA: Diagnosis not present

## 2013-03-16 DIAGNOSIS — I2510 Atherosclerotic heart disease of native coronary artery without angina pectoris: Secondary | ICD-10-CM | POA: Diagnosis not present

## 2013-03-16 DIAGNOSIS — E119 Type 2 diabetes mellitus without complications: Secondary | ICD-10-CM | POA: Diagnosis not present

## 2013-03-17 DIAGNOSIS — IMO0001 Reserved for inherently not codable concepts without codable children: Secondary | ICD-10-CM | POA: Diagnosis not present

## 2013-03-17 DIAGNOSIS — I2510 Atherosclerotic heart disease of native coronary artery without angina pectoris: Secondary | ICD-10-CM | POA: Diagnosis not present

## 2013-03-17 DIAGNOSIS — E119 Type 2 diabetes mellitus without complications: Secondary | ICD-10-CM | POA: Diagnosis not present

## 2013-03-17 DIAGNOSIS — Z471 Aftercare following joint replacement surgery: Secondary | ICD-10-CM | POA: Diagnosis not present

## 2013-03-17 DIAGNOSIS — Z96659 Presence of unspecified artificial knee joint: Secondary | ICD-10-CM | POA: Diagnosis not present

## 2013-03-18 DIAGNOSIS — Z96659 Presence of unspecified artificial knee joint: Secondary | ICD-10-CM | POA: Diagnosis not present

## 2013-03-18 DIAGNOSIS — I2510 Atherosclerotic heart disease of native coronary artery without angina pectoris: Secondary | ICD-10-CM | POA: Diagnosis not present

## 2013-03-18 DIAGNOSIS — Z471 Aftercare following joint replacement surgery: Secondary | ICD-10-CM | POA: Diagnosis not present

## 2013-03-18 DIAGNOSIS — E119 Type 2 diabetes mellitus without complications: Secondary | ICD-10-CM | POA: Diagnosis not present

## 2013-03-18 DIAGNOSIS — IMO0001 Reserved for inherently not codable concepts without codable children: Secondary | ICD-10-CM | POA: Diagnosis not present

## 2013-03-22 DIAGNOSIS — Z471 Aftercare following joint replacement surgery: Secondary | ICD-10-CM | POA: Diagnosis not present

## 2013-03-22 DIAGNOSIS — Z96659 Presence of unspecified artificial knee joint: Secondary | ICD-10-CM | POA: Diagnosis not present

## 2013-03-22 DIAGNOSIS — E119 Type 2 diabetes mellitus without complications: Secondary | ICD-10-CM | POA: Diagnosis not present

## 2013-03-22 DIAGNOSIS — I2510 Atherosclerotic heart disease of native coronary artery without angina pectoris: Secondary | ICD-10-CM | POA: Diagnosis not present

## 2013-03-22 DIAGNOSIS — IMO0001 Reserved for inherently not codable concepts without codable children: Secondary | ICD-10-CM | POA: Diagnosis not present

## 2013-03-23 DIAGNOSIS — I2510 Atherosclerotic heart disease of native coronary artery without angina pectoris: Secondary | ICD-10-CM | POA: Diagnosis not present

## 2013-03-23 DIAGNOSIS — Z96659 Presence of unspecified artificial knee joint: Secondary | ICD-10-CM | POA: Diagnosis not present

## 2013-03-23 DIAGNOSIS — IMO0001 Reserved for inherently not codable concepts without codable children: Secondary | ICD-10-CM | POA: Diagnosis not present

## 2013-03-23 DIAGNOSIS — E119 Type 2 diabetes mellitus without complications: Secondary | ICD-10-CM | POA: Diagnosis not present

## 2013-03-23 DIAGNOSIS — Z471 Aftercare following joint replacement surgery: Secondary | ICD-10-CM | POA: Diagnosis not present

## 2013-03-29 DIAGNOSIS — T8450XA Infection and inflammatory reaction due to unspecified internal joint prosthesis, initial encounter: Secondary | ICD-10-CM | POA: Diagnosis not present

## 2013-03-29 DIAGNOSIS — Z96659 Presence of unspecified artificial knee joint: Secondary | ICD-10-CM | POA: Diagnosis not present

## 2013-03-29 DIAGNOSIS — M25569 Pain in unspecified knee: Secondary | ICD-10-CM | POA: Diagnosis not present

## 2013-04-06 DIAGNOSIS — M25569 Pain in unspecified knee: Secondary | ICD-10-CM | POA: Diagnosis not present

## 2013-04-11 DIAGNOSIS — M25569 Pain in unspecified knee: Secondary | ICD-10-CM | POA: Diagnosis not present

## 2013-04-14 DIAGNOSIS — M25569 Pain in unspecified knee: Secondary | ICD-10-CM | POA: Diagnosis not present

## 2013-05-05 DIAGNOSIS — Z96651 Presence of right artificial knee joint: Secondary | ICD-10-CM | POA: Insufficient documentation

## 2013-05-05 DIAGNOSIS — Z471 Aftercare following joint replacement surgery: Secondary | ICD-10-CM | POA: Diagnosis not present

## 2013-05-05 DIAGNOSIS — Z96659 Presence of unspecified artificial knee joint: Secondary | ICD-10-CM | POA: Diagnosis not present

## 2013-05-05 HISTORY — DX: Presence of right artificial knee joint: Z96.651

## 2013-06-16 DIAGNOSIS — Z96659 Presence of unspecified artificial knee joint: Secondary | ICD-10-CM | POA: Diagnosis not present

## 2013-06-22 DIAGNOSIS — J069 Acute upper respiratory infection, unspecified: Secondary | ICD-10-CM | POA: Diagnosis not present

## 2013-06-22 DIAGNOSIS — J029 Acute pharyngitis, unspecified: Secondary | ICD-10-CM | POA: Diagnosis not present

## 2013-06-22 DIAGNOSIS — E119 Type 2 diabetes mellitus without complications: Secondary | ICD-10-CM | POA: Diagnosis not present

## 2013-07-05 DIAGNOSIS — J209 Acute bronchitis, unspecified: Secondary | ICD-10-CM | POA: Diagnosis not present

## 2013-07-05 DIAGNOSIS — J018 Other acute sinusitis: Secondary | ICD-10-CM | POA: Diagnosis not present

## 2013-07-11 DIAGNOSIS — J45901 Unspecified asthma with (acute) exacerbation: Secondary | ICD-10-CM | POA: Diagnosis not present

## 2013-08-18 DIAGNOSIS — I10 Essential (primary) hypertension: Secondary | ICD-10-CM | POA: Diagnosis not present

## 2013-08-18 DIAGNOSIS — E119 Type 2 diabetes mellitus without complications: Secondary | ICD-10-CM | POA: Diagnosis not present

## 2013-08-18 DIAGNOSIS — E782 Mixed hyperlipidemia: Secondary | ICD-10-CM | POA: Diagnosis not present

## 2013-08-18 DIAGNOSIS — K2100 Gastro-esophageal reflux disease with esophagitis, without bleeding: Secondary | ICD-10-CM | POA: Diagnosis not present

## 2013-09-15 DIAGNOSIS — J0120 Acute ethmoidal sinusitis, unspecified: Secondary | ICD-10-CM | POA: Diagnosis not present

## 2013-10-06 DIAGNOSIS — J218 Acute bronchiolitis due to other specified organisms: Secondary | ICD-10-CM | POA: Diagnosis not present

## 2013-10-06 DIAGNOSIS — E119 Type 2 diabetes mellitus without complications: Secondary | ICD-10-CM | POA: Diagnosis not present

## 2013-10-06 DIAGNOSIS — J209 Acute bronchitis, unspecified: Secondary | ICD-10-CM | POA: Diagnosis not present

## 2013-10-06 DIAGNOSIS — R229 Localized swelling, mass and lump, unspecified: Secondary | ICD-10-CM | POA: Diagnosis not present

## 2013-10-12 DIAGNOSIS — I10 Essential (primary) hypertension: Secondary | ICD-10-CM | POA: Diagnosis not present

## 2013-10-12 DIAGNOSIS — R079 Chest pain, unspecified: Secondary | ICD-10-CM | POA: Diagnosis not present

## 2013-10-12 DIAGNOSIS — E119 Type 2 diabetes mellitus without complications: Secondary | ICD-10-CM | POA: Diagnosis not present

## 2013-10-12 DIAGNOSIS — Z1231 Encounter for screening mammogram for malignant neoplasm of breast: Secondary | ICD-10-CM | POA: Diagnosis not present

## 2013-10-19 DIAGNOSIS — D1739 Benign lipomatous neoplasm of skin and subcutaneous tissue of other sites: Secondary | ICD-10-CM | POA: Diagnosis not present

## 2013-10-19 DIAGNOSIS — R079 Chest pain, unspecified: Secondary | ICD-10-CM | POA: Diagnosis not present

## 2013-10-19 DIAGNOSIS — E119 Type 2 diabetes mellitus without complications: Secondary | ICD-10-CM | POA: Diagnosis not present

## 2013-10-19 DIAGNOSIS — I10 Essential (primary) hypertension: Secondary | ICD-10-CM | POA: Diagnosis not present

## 2013-11-17 DIAGNOSIS — Z96659 Presence of unspecified artificial knee joint: Secondary | ICD-10-CM | POA: Diagnosis not present

## 2013-11-26 DIAGNOSIS — R5381 Other malaise: Secondary | ICD-10-CM | POA: Diagnosis not present

## 2013-11-26 DIAGNOSIS — R5383 Other fatigue: Secondary | ICD-10-CM | POA: Diagnosis not present

## 2013-12-09 DIAGNOSIS — J309 Allergic rhinitis, unspecified: Secondary | ICD-10-CM | POA: Diagnosis not present

## 2013-12-09 DIAGNOSIS — J018 Other acute sinusitis: Secondary | ICD-10-CM | POA: Diagnosis not present

## 2013-12-09 DIAGNOSIS — J029 Acute pharyngitis, unspecified: Secondary | ICD-10-CM | POA: Diagnosis not present

## 2013-12-28 DIAGNOSIS — J018 Other acute sinusitis: Secondary | ICD-10-CM | POA: Diagnosis not present

## 2013-12-28 DIAGNOSIS — J209 Acute bronchitis, unspecified: Secondary | ICD-10-CM | POA: Diagnosis not present

## 2013-12-28 DIAGNOSIS — G4734 Idiopathic sleep related nonobstructive alveolar hypoventilation: Secondary | ICD-10-CM | POA: Diagnosis not present

## 2014-01-26 DIAGNOSIS — B370 Candidal stomatitis: Secondary | ICD-10-CM | POA: Diagnosis not present

## 2014-01-26 DIAGNOSIS — L74519 Primary focal hyperhidrosis, unspecified: Secondary | ICD-10-CM | POA: Diagnosis not present

## 2014-01-26 DIAGNOSIS — M79609 Pain in unspecified limb: Secondary | ICD-10-CM | POA: Diagnosis not present

## 2014-01-26 DIAGNOSIS — B3781 Candidal esophagitis: Secondary | ICD-10-CM | POA: Diagnosis not present

## 2014-01-30 DIAGNOSIS — E119 Type 2 diabetes mellitus without complications: Secondary | ICD-10-CM | POA: Diagnosis not present

## 2014-02-08 DIAGNOSIS — G4730 Sleep apnea, unspecified: Secondary | ICD-10-CM | POA: Diagnosis not present

## 2014-02-08 DIAGNOSIS — I10 Essential (primary) hypertension: Secondary | ICD-10-CM | POA: Diagnosis not present

## 2014-02-08 DIAGNOSIS — I2510 Atherosclerotic heart disease of native coronary artery without angina pectoris: Secondary | ICD-10-CM | POA: Diagnosis not present

## 2014-02-08 DIAGNOSIS — E785 Hyperlipidemia, unspecified: Secondary | ICD-10-CM | POA: Diagnosis not present

## 2014-02-08 DIAGNOSIS — I359 Nonrheumatic aortic valve disorder, unspecified: Secondary | ICD-10-CM | POA: Diagnosis not present

## 2014-02-13 DIAGNOSIS — T148 Other injury of unspecified body region: Secondary | ICD-10-CM | POA: Diagnosis not present

## 2014-02-16 DIAGNOSIS — Z780 Asymptomatic menopausal state: Secondary | ICD-10-CM | POA: Diagnosis not present

## 2014-02-16 DIAGNOSIS — G4700 Insomnia, unspecified: Secondary | ICD-10-CM | POA: Diagnosis not present

## 2014-02-16 DIAGNOSIS — M810 Age-related osteoporosis without current pathological fracture: Secondary | ICD-10-CM | POA: Diagnosis not present

## 2014-02-16 DIAGNOSIS — B880 Other acariasis: Secondary | ICD-10-CM | POA: Diagnosis not present

## 2014-05-23 DIAGNOSIS — Z23 Encounter for immunization: Secondary | ICD-10-CM | POA: Diagnosis not present

## 2014-06-01 DIAGNOSIS — E119 Type 2 diabetes mellitus without complications: Secondary | ICD-10-CM | POA: Diagnosis not present

## 2014-06-22 DIAGNOSIS — D485 Neoplasm of uncertain behavior of skin: Secondary | ICD-10-CM | POA: Diagnosis not present

## 2014-06-22 DIAGNOSIS — L570 Actinic keratosis: Secondary | ICD-10-CM | POA: Diagnosis not present

## 2014-07-03 DIAGNOSIS — K521 Toxic gastroenteritis and colitis: Secondary | ICD-10-CM | POA: Diagnosis not present

## 2014-07-25 DIAGNOSIS — J3489 Other specified disorders of nose and nasal sinuses: Secondary | ICD-10-CM | POA: Diagnosis not present

## 2014-08-11 ENCOUNTER — Ambulatory Visit: Payer: Self-pay

## 2014-08-17 ENCOUNTER — Ambulatory Visit: Payer: Self-pay

## 2014-08-19 DIAGNOSIS — J209 Acute bronchitis, unspecified: Secondary | ICD-10-CM | POA: Diagnosis not present

## 2014-08-19 DIAGNOSIS — E1165 Type 2 diabetes mellitus with hyperglycemia: Secondary | ICD-10-CM | POA: Diagnosis not present

## 2014-08-19 DIAGNOSIS — J069 Acute upper respiratory infection, unspecified: Secondary | ICD-10-CM | POA: Diagnosis not present

## 2014-08-28 ENCOUNTER — Ambulatory Visit: Payer: Self-pay

## 2014-08-31 DIAGNOSIS — B9689 Other specified bacterial agents as the cause of diseases classified elsewhere: Secondary | ICD-10-CM | POA: Diagnosis not present

## 2014-08-31 DIAGNOSIS — J019 Acute sinusitis, unspecified: Secondary | ICD-10-CM | POA: Diagnosis not present

## 2014-10-31 DIAGNOSIS — M4316 Spondylolisthesis, lumbar region: Secondary | ICD-10-CM | POA: Diagnosis not present

## 2014-10-31 DIAGNOSIS — M5136 Other intervertebral disc degeneration, lumbar region: Secondary | ICD-10-CM | POA: Diagnosis not present

## 2014-10-31 DIAGNOSIS — M545 Low back pain: Secondary | ICD-10-CM | POA: Diagnosis not present

## 2014-10-31 DIAGNOSIS — N644 Mastodynia: Secondary | ICD-10-CM | POA: Diagnosis not present

## 2014-11-06 DIAGNOSIS — N644 Mastodynia: Secondary | ICD-10-CM | POA: Diagnosis not present

## 2014-12-02 DIAGNOSIS — J209 Acute bronchitis, unspecified: Secondary | ICD-10-CM | POA: Diagnosis not present

## 2014-12-02 DIAGNOSIS — J208 Acute bronchitis due to other specified organisms: Secondary | ICD-10-CM | POA: Diagnosis not present

## 2014-12-02 DIAGNOSIS — J019 Acute sinusitis, unspecified: Secondary | ICD-10-CM | POA: Diagnosis not present

## 2014-12-02 DIAGNOSIS — J0140 Acute pansinusitis, unspecified: Secondary | ICD-10-CM | POA: Diagnosis not present

## 2014-12-07 DIAGNOSIS — E119 Type 2 diabetes mellitus without complications: Secondary | ICD-10-CM | POA: Diagnosis not present

## 2015-01-15 DIAGNOSIS — R062 Wheezing: Secondary | ICD-10-CM | POA: Diagnosis not present

## 2015-01-15 DIAGNOSIS — R05 Cough: Secondary | ICD-10-CM | POA: Diagnosis not present

## 2015-01-26 DIAGNOSIS — J40 Bronchitis, not specified as acute or chronic: Secondary | ICD-10-CM | POA: Diagnosis not present

## 2015-01-26 DIAGNOSIS — R197 Diarrhea, unspecified: Secondary | ICD-10-CM | POA: Diagnosis not present

## 2015-01-26 DIAGNOSIS — E1165 Type 2 diabetes mellitus with hyperglycemia: Secondary | ICD-10-CM | POA: Diagnosis not present

## 2015-02-01 DIAGNOSIS — E780 Pure hypercholesterolemia: Secondary | ICD-10-CM | POA: Diagnosis not present

## 2015-02-01 DIAGNOSIS — I10 Essential (primary) hypertension: Secondary | ICD-10-CM | POA: Diagnosis not present

## 2015-02-01 DIAGNOSIS — E663 Overweight: Secondary | ICD-10-CM | POA: Diagnosis not present

## 2015-02-01 DIAGNOSIS — R079 Chest pain, unspecified: Secondary | ICD-10-CM | POA: Diagnosis not present

## 2015-02-15 DIAGNOSIS — E119 Type 2 diabetes mellitus without complications: Secondary | ICD-10-CM | POA: Diagnosis not present

## 2015-02-15 DIAGNOSIS — H2513 Age-related nuclear cataract, bilateral: Secondary | ICD-10-CM | POA: Diagnosis not present

## 2015-03-03 DIAGNOSIS — J309 Allergic rhinitis, unspecified: Secondary | ICD-10-CM | POA: Diagnosis not present

## 2015-03-03 DIAGNOSIS — E780 Pure hypercholesterolemia: Secondary | ICD-10-CM | POA: Diagnosis not present

## 2015-03-03 DIAGNOSIS — R609 Edema, unspecified: Secondary | ICD-10-CM | POA: Diagnosis not present

## 2015-03-03 DIAGNOSIS — G4700 Insomnia, unspecified: Secondary | ICD-10-CM | POA: Diagnosis not present

## 2015-04-03 DIAGNOSIS — E663 Overweight: Secondary | ICD-10-CM | POA: Diagnosis not present

## 2015-04-03 DIAGNOSIS — R079 Chest pain, unspecified: Secondary | ICD-10-CM | POA: Diagnosis not present

## 2015-04-03 DIAGNOSIS — I10 Essential (primary) hypertension: Secondary | ICD-10-CM | POA: Diagnosis not present

## 2015-04-03 DIAGNOSIS — E780 Pure hypercholesterolemia: Secondary | ICD-10-CM | POA: Diagnosis not present

## 2015-04-11 DIAGNOSIS — E119 Type 2 diabetes mellitus without complications: Secondary | ICD-10-CM | POA: Diagnosis not present

## 2015-04-11 DIAGNOSIS — Z1389 Encounter for screening for other disorder: Secondary | ICD-10-CM | POA: Diagnosis not present

## 2015-04-11 DIAGNOSIS — E782 Mixed hyperlipidemia: Secondary | ICD-10-CM | POA: Diagnosis not present

## 2015-04-11 DIAGNOSIS — Z9181 History of falling: Secondary | ICD-10-CM | POA: Diagnosis not present

## 2015-04-11 DIAGNOSIS — I10 Essential (primary) hypertension: Secondary | ICD-10-CM | POA: Diagnosis not present

## 2015-04-11 DIAGNOSIS — Z0000 Encounter for general adult medical examination without abnormal findings: Secondary | ICD-10-CM | POA: Diagnosis not present

## 2015-04-11 DIAGNOSIS — E1165 Type 2 diabetes mellitus with hyperglycemia: Secondary | ICD-10-CM | POA: Diagnosis not present

## 2015-04-26 DIAGNOSIS — M4316 Spondylolisthesis, lumbar region: Secondary | ICD-10-CM | POA: Diagnosis not present

## 2015-04-26 DIAGNOSIS — M4726 Other spondylosis with radiculopathy, lumbar region: Secondary | ICD-10-CM | POA: Diagnosis not present

## 2015-04-26 DIAGNOSIS — M545 Low back pain: Secondary | ICD-10-CM | POA: Diagnosis not present

## 2015-04-26 DIAGNOSIS — M5416 Radiculopathy, lumbar region: Secondary | ICD-10-CM | POA: Diagnosis not present

## 2015-04-26 DIAGNOSIS — M5136 Other intervertebral disc degeneration, lumbar region: Secondary | ICD-10-CM | POA: Diagnosis not present

## 2015-05-03 DIAGNOSIS — M4316 Spondylolisthesis, lumbar region: Secondary | ICD-10-CM | POA: Diagnosis not present

## 2015-05-03 DIAGNOSIS — M545 Low back pain: Secondary | ICD-10-CM | POA: Diagnosis not present

## 2015-05-03 DIAGNOSIS — M5136 Other intervertebral disc degeneration, lumbar region: Secondary | ICD-10-CM | POA: Diagnosis not present

## 2015-05-08 DIAGNOSIS — M4806 Spinal stenosis, lumbar region: Secondary | ICD-10-CM | POA: Diagnosis not present

## 2015-05-08 DIAGNOSIS — M4316 Spondylolisthesis, lumbar region: Secondary | ICD-10-CM | POA: Diagnosis not present

## 2015-05-08 DIAGNOSIS — M5126 Other intervertebral disc displacement, lumbar region: Secondary | ICD-10-CM | POA: Diagnosis not present

## 2015-05-08 DIAGNOSIS — M545 Low back pain: Secondary | ICD-10-CM | POA: Diagnosis not present

## 2015-05-08 DIAGNOSIS — M5136 Other intervertebral disc degeneration, lumbar region: Secondary | ICD-10-CM | POA: Diagnosis not present

## 2015-05-10 DIAGNOSIS — M545 Low back pain: Secondary | ICD-10-CM | POA: Diagnosis not present

## 2015-05-10 DIAGNOSIS — M5136 Other intervertebral disc degeneration, lumbar region: Secondary | ICD-10-CM | POA: Diagnosis not present

## 2015-05-10 DIAGNOSIS — Z23 Encounter for immunization: Secondary | ICD-10-CM | POA: Diagnosis not present

## 2015-05-10 DIAGNOSIS — M4316 Spondylolisthesis, lumbar region: Secondary | ICD-10-CM | POA: Diagnosis not present

## 2015-05-10 IMAGING — CR DG CHEST 2V
2 series · 2 of 2 positions shown · non-contrast
Comparison: None.

CLINICAL DATA: Preop for knee replacement

CHEST - 2 VIEW

[w chest pa]
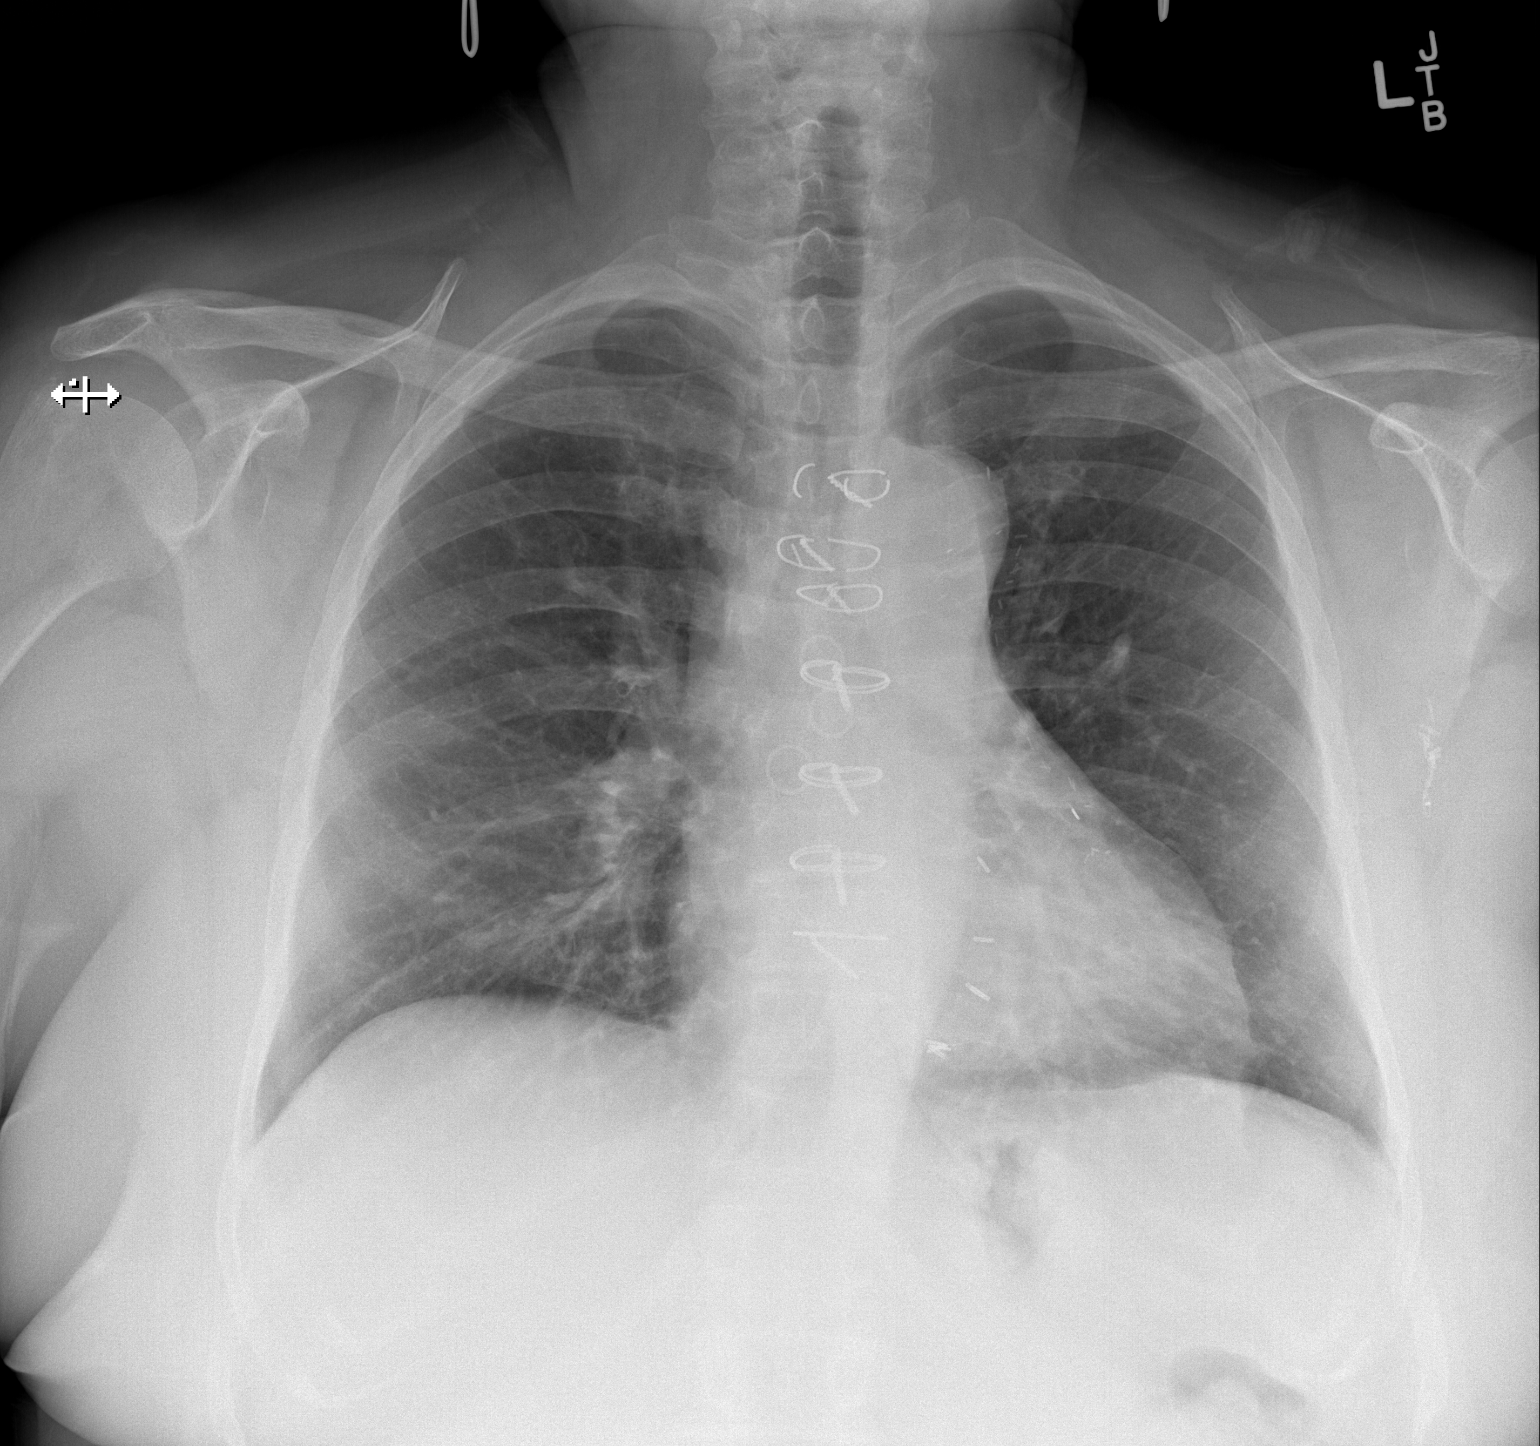

[w chest lat]
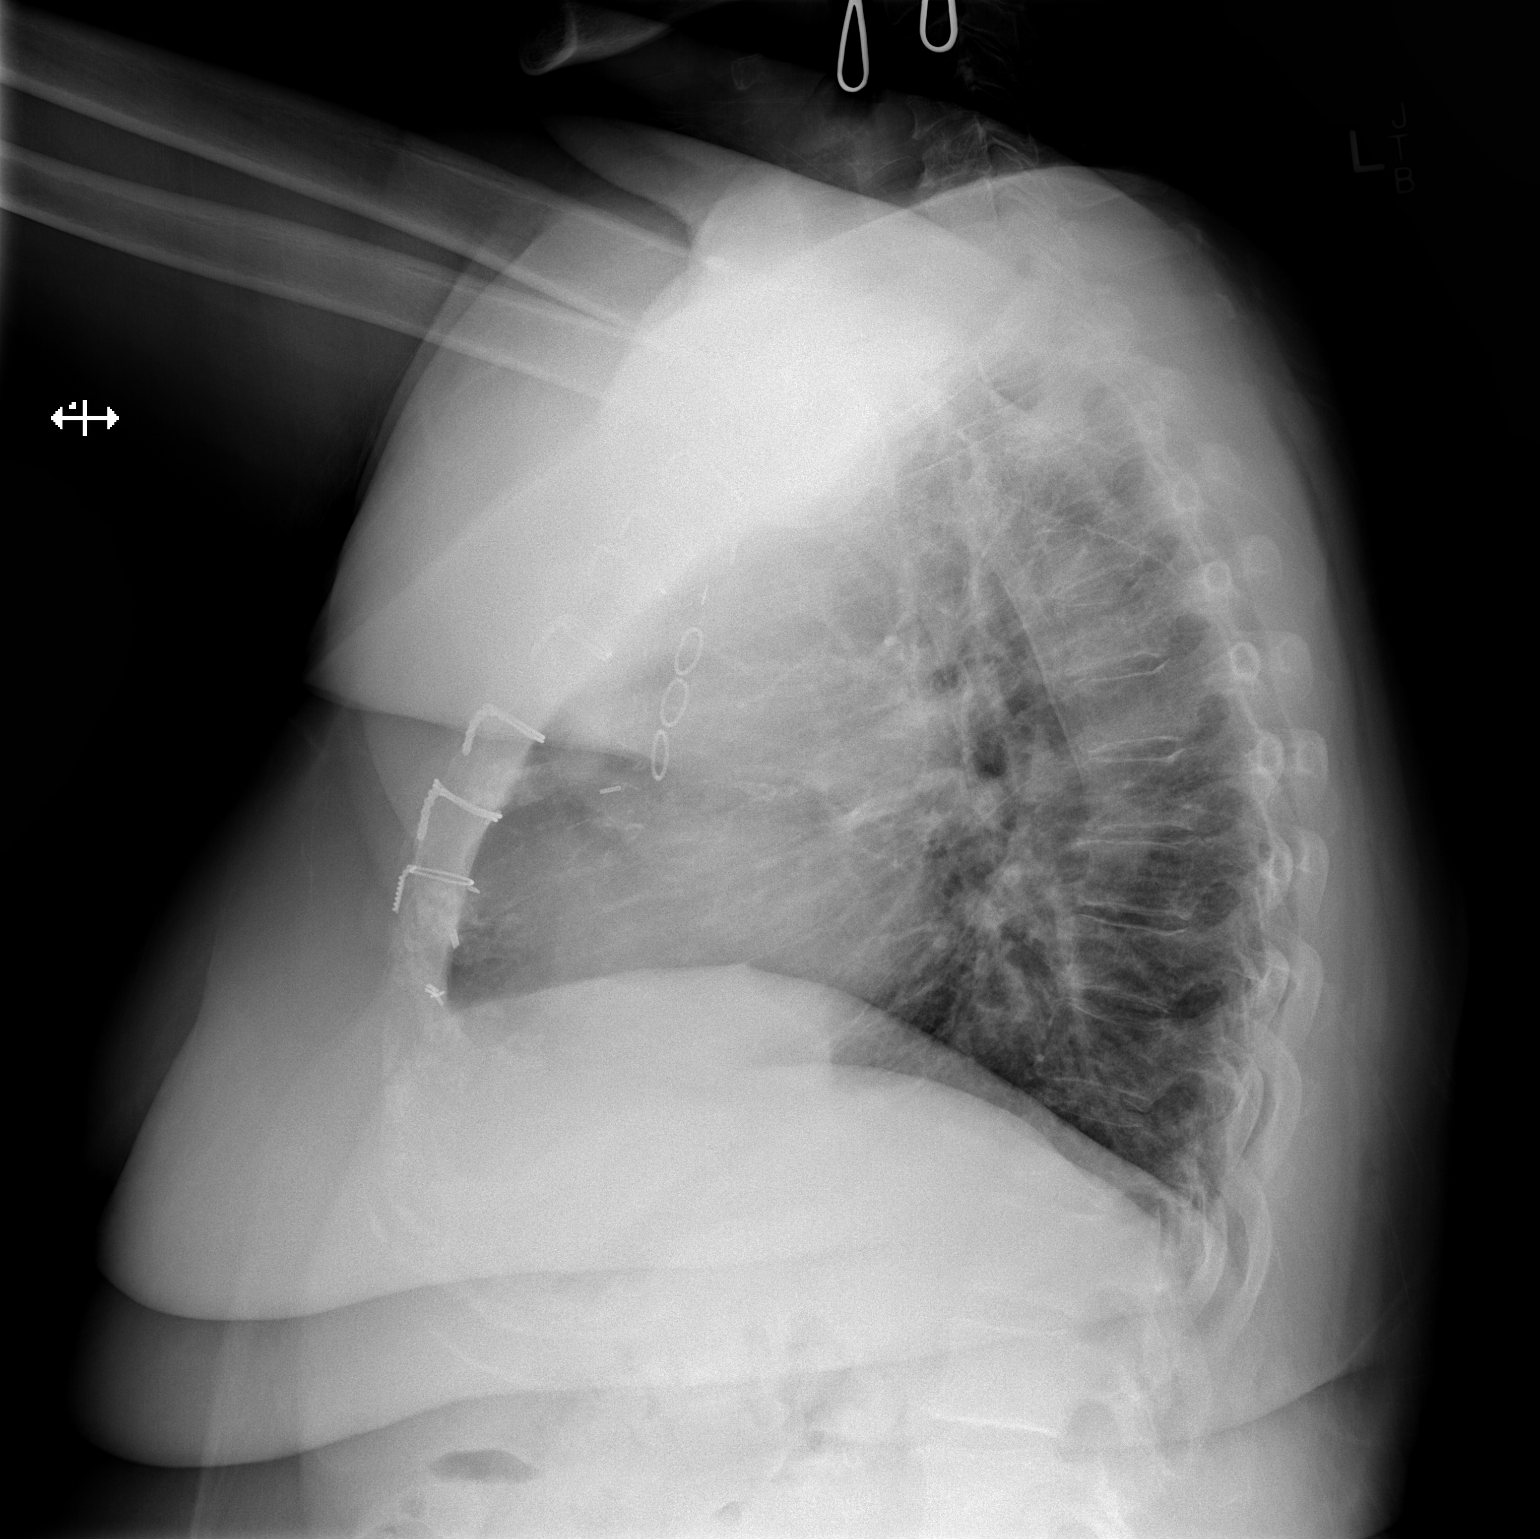

[2 of 2 positions shown; findings below may reference images not displayed]

FINDINGS: Lateral view degraded by patient arm position.

Prior median sternotomy.  Surgical clips in the left axilla.
Midline trachea.  The proximal most median sternotomy wires are
fractured. Borderline cardiomegaly.     Mediastinal contours
otherwise within normal limits.  No pleural effusion or
pneumothorax.  Mildly low lung volumes.
IMPRESSION: Borderline cardiomegaly, without acute disease.

## 2015-05-15 DIAGNOSIS — M4316 Spondylolisthesis, lumbar region: Secondary | ICD-10-CM | POA: Diagnosis not present

## 2015-05-15 DIAGNOSIS — M5136 Other intervertebral disc degeneration, lumbar region: Secondary | ICD-10-CM | POA: Diagnosis not present

## 2015-05-15 DIAGNOSIS — M545 Low back pain: Secondary | ICD-10-CM | POA: Diagnosis not present

## 2015-05-17 DIAGNOSIS — M5136 Other intervertebral disc degeneration, lumbar region: Secondary | ICD-10-CM | POA: Diagnosis not present

## 2015-05-17 DIAGNOSIS — M4316 Spondylolisthesis, lumbar region: Secondary | ICD-10-CM | POA: Diagnosis not present

## 2015-05-17 DIAGNOSIS — M545 Low back pain: Secondary | ICD-10-CM | POA: Diagnosis not present

## 2015-05-22 DIAGNOSIS — M545 Low back pain: Secondary | ICD-10-CM | POA: Diagnosis not present

## 2015-05-22 DIAGNOSIS — M5136 Other intervertebral disc degeneration, lumbar region: Secondary | ICD-10-CM | POA: Diagnosis not present

## 2015-05-22 DIAGNOSIS — M4316 Spondylolisthesis, lumbar region: Secondary | ICD-10-CM | POA: Diagnosis not present

## 2015-05-24 DIAGNOSIS — M5136 Other intervertebral disc degeneration, lumbar region: Secondary | ICD-10-CM | POA: Diagnosis not present

## 2015-05-24 DIAGNOSIS — M545 Low back pain: Secondary | ICD-10-CM | POA: Diagnosis not present

## 2015-05-24 DIAGNOSIS — M4316 Spondylolisthesis, lumbar region: Secondary | ICD-10-CM | POA: Diagnosis not present

## 2015-05-28 DIAGNOSIS — M5136 Other intervertebral disc degeneration, lumbar region: Secondary | ICD-10-CM | POA: Diagnosis not present

## 2015-05-28 DIAGNOSIS — M545 Low back pain: Secondary | ICD-10-CM | POA: Diagnosis not present

## 2015-05-28 DIAGNOSIS — M4316 Spondylolisthesis, lumbar region: Secondary | ICD-10-CM | POA: Diagnosis not present

## 2015-05-29 DIAGNOSIS — M4806 Spinal stenosis, lumbar region: Secondary | ICD-10-CM | POA: Diagnosis not present

## 2015-05-29 DIAGNOSIS — M4726 Other spondylosis with radiculopathy, lumbar region: Secondary | ICD-10-CM | POA: Diagnosis not present

## 2015-05-29 DIAGNOSIS — M4316 Spondylolisthesis, lumbar region: Secondary | ICD-10-CM | POA: Diagnosis not present

## 2015-05-29 DIAGNOSIS — M5136 Other intervertebral disc degeneration, lumbar region: Secondary | ICD-10-CM | POA: Diagnosis not present

## 2015-05-31 DIAGNOSIS — M545 Low back pain: Secondary | ICD-10-CM | POA: Diagnosis not present

## 2015-05-31 DIAGNOSIS — M4316 Spondylolisthesis, lumbar region: Secondary | ICD-10-CM | POA: Diagnosis not present

## 2015-05-31 DIAGNOSIS — M5136 Other intervertebral disc degeneration, lumbar region: Secondary | ICD-10-CM | POA: Diagnosis not present

## 2015-06-05 DIAGNOSIS — M4316 Spondylolisthesis, lumbar region: Secondary | ICD-10-CM | POA: Diagnosis not present

## 2015-06-05 DIAGNOSIS — M545 Low back pain: Secondary | ICD-10-CM | POA: Diagnosis not present

## 2015-06-05 DIAGNOSIS — M5136 Other intervertebral disc degeneration, lumbar region: Secondary | ICD-10-CM | POA: Diagnosis not present

## 2015-06-07 DIAGNOSIS — M4316 Spondylolisthesis, lumbar region: Secondary | ICD-10-CM | POA: Diagnosis not present

## 2015-06-07 DIAGNOSIS — M545 Low back pain: Secondary | ICD-10-CM | POA: Diagnosis not present

## 2015-06-07 DIAGNOSIS — M5136 Other intervertebral disc degeneration, lumbar region: Secondary | ICD-10-CM | POA: Diagnosis not present

## 2015-06-14 DIAGNOSIS — M4316 Spondylolisthesis, lumbar region: Secondary | ICD-10-CM | POA: Diagnosis not present

## 2015-06-14 DIAGNOSIS — M545 Low back pain: Secondary | ICD-10-CM | POA: Diagnosis not present

## 2015-06-14 DIAGNOSIS — M5136 Other intervertebral disc degeneration, lumbar region: Secondary | ICD-10-CM | POA: Diagnosis not present

## 2015-07-09 ENCOUNTER — Ambulatory Visit (INDEPENDENT_AMBULATORY_CARE_PROVIDER_SITE_OTHER): Payer: Medicare Other

## 2015-07-09 ENCOUNTER — Ambulatory Visit (INDEPENDENT_AMBULATORY_CARE_PROVIDER_SITE_OTHER): Payer: Medicare Other | Admitting: Podiatry

## 2015-07-09 DIAGNOSIS — B351 Tinea unguium: Secondary | ICD-10-CM

## 2015-07-09 DIAGNOSIS — M722 Plantar fascial fibromatosis: Secondary | ICD-10-CM

## 2015-07-09 DIAGNOSIS — R52 Pain, unspecified: Secondary | ICD-10-CM | POA: Diagnosis not present

## 2015-07-09 NOTE — Progress Notes (Signed)
Subjective:     Patient ID: Patricia Dawson, female   DOB: 27-Mar-1941, 74 y.o.   MRN: [id number]  HPI this patient presents to the office stating that she has a painful knot on the bottom of her left forefoot. She states the knot has been present for a long time but only recently has become painful. She denies any trauma or injury to the foot. She is also concerned about a fungal infection on the top of the toenail on her right big toe. She presents to the office today for an evaluation of her conditions and treatment for these conditions   Review of Systems     Objective:   Physical Exam GENERAL APPEARANCE: Alert, conversant. Appropriately groomed. No acute distress.  VASCULAR: Pedal pulses palpable at  [facility] and PT bilateral.  Capillary refill time is immediate to all digits,  Normal temperature gradient.  Digital hair growth is present bilateral  NEUROLOGIC: sensation is normal to 5.07 monofilament at 5/5 sites bilateral.  Light touch is intact bilateral, Muscle strength normal.  MUSCULOSKELETAL: acceptable muscle strength, tone and stability bilateral.  Intrinsic muscluature intact bilateral.  Rectus appearance of foot and digits noted bilateral. Hard nodular growth sub second MPJ left foot.  No redness or swelling noted.  DERMATOLOGIC: skin color, texture, and turgor are within normal limits.  No preulcerative lesions or ulcers  are seen, no interdigital maceration noted.  No open lesions present.  Digital nails are asymptomatic. No drainage noted. Thick disfigured discolored right hallux toenail right foot.      Assessment:     Fibroma left forefoot  Onychomycosis right hallux.     Plan:     IE  X-ray taken.  Injection into fibroma to shrink the fibroma.  Plan to bring topical fungal solution to this office.  RTC 2 weeks for re evaluation.  Gardiner Barefoot DPM

## 2015-07-10 DIAGNOSIS — R109 Unspecified abdominal pain: Secondary | ICD-10-CM | POA: Diagnosis not present

## 2015-07-10 DIAGNOSIS — I517 Cardiomegaly: Secondary | ICD-10-CM | POA: Diagnosis not present

## 2015-07-10 DIAGNOSIS — R0789 Other chest pain: Secondary | ICD-10-CM | POA: Diagnosis not present

## 2015-07-17 DIAGNOSIS — K760 Fatty (change of) liver, not elsewhere classified: Secondary | ICD-10-CM | POA: Diagnosis not present

## 2015-07-17 DIAGNOSIS — D3502 Benign neoplasm of left adrenal gland: Secondary | ICD-10-CM | POA: Diagnosis not present

## 2015-07-17 DIAGNOSIS — R109 Unspecified abdominal pain: Secondary | ICD-10-CM | POA: Diagnosis not present

## 2015-07-23 ENCOUNTER — Ambulatory Visit (INDEPENDENT_AMBULATORY_CARE_PROVIDER_SITE_OTHER): Payer: Medicare Other | Admitting: Podiatry

## 2015-07-23 ENCOUNTER — Encounter: Payer: Self-pay | Admitting: Podiatry

## 2015-07-23 VITALS — BP 137/75 | HR 54 | Resp 14

## 2015-07-23 DIAGNOSIS — M722 Plantar fascial fibromatosis: Secondary | ICD-10-CM

## 2015-07-23 NOTE — Progress Notes (Signed)
Subjective:     Patient ID: Patricia Dawson, female   DOB: October 19, 1940, 74 y.o.   MRN: [id number]  HPI this patient returns to the office follow-up for a diagnosis of the fibroma for left forefoot. She states that the fibroma feels smaller and doesn't hurt as bad as she walks. She feels that there has been marked improvement from the knot that she presented with 2 weeks ago. She also is concerned about her thick disfigured discolored big toenail on her right foot and requests formula 3. She presents the office for evaluation and treatment   Review of Systems     Objective:   Physical Exam Physical Exam GENERAL APPEARANCE: Alert, conversant. Appropriately groomed. No acute distress.  VASCULAR: Pedal pulses palpable at [facility] and PT bilateral. Capillary refill time is immediate to all digits, Normal temperature gradient. Digital hair growth is present bilateral  NEUROLOGIC: sensation is normal to 5.07 monofilament at 5/5 sites bilateral. Light touch is intact bilateral, Muscle strength normal.  MUSCULOSKELETAL: acceptable muscle strength, tone and stability bilateral. Intrinsic muscluature intact bilateral. Rectus appearance of foot and digits noted bilateral.  Hard nodular mass is not pappable. No redness or swelling noted.  DERMATOLOGIC: skin color, texture, and turgor are within normal limits. No preulcerative lesions or ulcers are seen, no interdigital maceration noted. No open lesions present. Digital nails are asymptomatic. No drainage noted. Thick disfigured discolored right hallux toenail right foot.    Assessment:     Fibroma  Left foot.  ROV>  To bring formula three next visit.     Plan:     To allow fibroma to continue to shrink.  To bring formula 3 to office for her to use.  Gardiner Barefoot DPM

## 2015-07-24 DIAGNOSIS — I351 Nonrheumatic aortic (valve) insufficiency: Secondary | ICD-10-CM

## 2015-07-24 DIAGNOSIS — I10 Essential (primary) hypertension: Secondary | ICD-10-CM | POA: Insufficient documentation

## 2015-07-24 DIAGNOSIS — E785 Hyperlipidemia, unspecified: Secondary | ICD-10-CM | POA: Insufficient documentation

## 2015-07-24 DIAGNOSIS — Z01810 Encounter for preprocedural cardiovascular examination: Secondary | ICD-10-CM

## 2015-07-24 DIAGNOSIS — I25119 Atherosclerotic heart disease of native coronary artery with unspecified angina pectoris: Secondary | ICD-10-CM | POA: Insufficient documentation

## 2015-07-24 HISTORY — DX: Nonrheumatic aortic (valve) insufficiency: I35.1

## 2015-07-24 HISTORY — DX: Atherosclerotic heart disease of native coronary artery with unspecified angina pectoris: I25.119

## 2015-07-24 HISTORY — DX: Essential (primary) hypertension: I10

## 2015-07-24 HISTORY — DX: Hyperlipidemia, unspecified: E78.5

## 2015-07-24 HISTORY — DX: Encounter for preprocedural cardiovascular examination: Z01.810

## 2015-07-25 DIAGNOSIS — E785 Hyperlipidemia, unspecified: Secondary | ICD-10-CM | POA: Diagnosis not present

## 2015-07-25 DIAGNOSIS — I25119 Atherosclerotic heart disease of native coronary artery with unspecified angina pectoris: Secondary | ICD-10-CM | POA: Diagnosis not present

## 2015-07-25 DIAGNOSIS — Z01810 Encounter for preprocedural cardiovascular examination: Secondary | ICD-10-CM | POA: Diagnosis not present

## 2015-07-25 DIAGNOSIS — I10 Essential (primary) hypertension: Secondary | ICD-10-CM | POA: Diagnosis not present

## 2015-07-25 DIAGNOSIS — Z6834 Body mass index (BMI) 34.0-34.9, adult: Secondary | ICD-10-CM | POA: Diagnosis not present

## 2015-07-26 DIAGNOSIS — Z01810 Encounter for preprocedural cardiovascular examination: Secondary | ICD-10-CM | POA: Diagnosis not present

## 2015-07-26 DIAGNOSIS — I25119 Atherosclerotic heart disease of native coronary artery with unspecified angina pectoris: Secondary | ICD-10-CM | POA: Diagnosis not present

## 2015-08-14 DIAGNOSIS — S29019A Strain of muscle and tendon of unspecified wall of thorax, initial encounter: Secondary | ICD-10-CM | POA: Diagnosis not present

## 2015-09-03 DIAGNOSIS — M5412 Radiculopathy, cervical region: Secondary | ICD-10-CM | POA: Diagnosis not present

## 2015-09-13 DIAGNOSIS — M542 Cervicalgia: Secondary | ICD-10-CM | POA: Diagnosis not present

## 2015-09-13 DIAGNOSIS — M5431 Sciatica, right side: Secondary | ICD-10-CM | POA: Diagnosis not present

## 2015-09-13 DIAGNOSIS — M4316 Spondylolisthesis, lumbar region: Secondary | ICD-10-CM | POA: Diagnosis not present

## 2015-09-13 DIAGNOSIS — M5432 Sciatica, left side: Secondary | ICD-10-CM | POA: Diagnosis not present

## 2015-09-13 DIAGNOSIS — M4806 Spinal stenosis, lumbar region: Secondary | ICD-10-CM | POA: Diagnosis not present

## 2015-09-18 DIAGNOSIS — M4802 Spinal stenosis, cervical region: Secondary | ICD-10-CM | POA: Diagnosis not present

## 2015-09-18 DIAGNOSIS — M5124 Other intervertebral disc displacement, thoracic region: Secondary | ICD-10-CM | POA: Diagnosis not present

## 2015-09-18 DIAGNOSIS — M5021 Other cervical disc displacement,  high cervical region: Secondary | ICD-10-CM | POA: Diagnosis not present

## 2015-09-18 DIAGNOSIS — M50222 Other cervical disc displacement at C5-C6 level: Secondary | ICD-10-CM | POA: Diagnosis not present

## 2015-09-18 DIAGNOSIS — M50221 Other cervical disc displacement at C4-C5 level: Secondary | ICD-10-CM | POA: Diagnosis not present

## 2015-09-18 DIAGNOSIS — M50321 Other cervical disc degeneration at C4-C5 level: Secondary | ICD-10-CM | POA: Diagnosis not present

## 2015-09-18 DIAGNOSIS — M4804 Spinal stenosis, thoracic region: Secondary | ICD-10-CM | POA: Diagnosis not present

## 2015-09-27 DIAGNOSIS — M4722 Other spondylosis with radiculopathy, cervical region: Secondary | ICD-10-CM | POA: Diagnosis not present

## 2015-09-27 DIAGNOSIS — M5136 Other intervertebral disc degeneration, lumbar region: Secondary | ICD-10-CM | POA: Diagnosis not present

## 2015-09-27 DIAGNOSIS — M4316 Spondylolisthesis, lumbar region: Secondary | ICD-10-CM | POA: Diagnosis not present

## 2015-09-27 DIAGNOSIS — M4806 Spinal stenosis, lumbar region: Secondary | ICD-10-CM | POA: Diagnosis not present

## 2015-10-09 DIAGNOSIS — M5033 Other cervical disc degeneration, cervicothoracic region: Secondary | ICD-10-CM | POA: Diagnosis not present

## 2015-10-19 DIAGNOSIS — E039 Hypothyroidism, unspecified: Secondary | ICD-10-CM | POA: Diagnosis not present

## 2015-10-19 DIAGNOSIS — Z79899 Other long term (current) drug therapy: Secondary | ICD-10-CM | POA: Diagnosis not present

## 2015-10-19 DIAGNOSIS — E782 Mixed hyperlipidemia: Secondary | ICD-10-CM | POA: Diagnosis not present

## 2015-10-19 DIAGNOSIS — I10 Essential (primary) hypertension: Secondary | ICD-10-CM | POA: Diagnosis not present

## 2015-10-19 DIAGNOSIS — E1165 Type 2 diabetes mellitus with hyperglycemia: Secondary | ICD-10-CM | POA: Diagnosis not present

## 2015-10-30 DIAGNOSIS — M4316 Spondylolisthesis, lumbar region: Secondary | ICD-10-CM | POA: Diagnosis not present

## 2015-10-30 DIAGNOSIS — M5013 Cervical disc disorder with radiculopathy, cervicothoracic region: Secondary | ICD-10-CM | POA: Diagnosis not present

## 2015-10-30 DIAGNOSIS — M5033 Other cervical disc degeneration, cervicothoracic region: Secondary | ICD-10-CM | POA: Diagnosis not present

## 2015-10-30 DIAGNOSIS — M47816 Spondylosis without myelopathy or radiculopathy, lumbar region: Secondary | ICD-10-CM | POA: Diagnosis not present

## 2015-11-07 DIAGNOSIS — M47816 Spondylosis without myelopathy or radiculopathy, lumbar region: Secondary | ICD-10-CM | POA: Diagnosis not present

## 2015-11-09 DIAGNOSIS — Z1231 Encounter for screening mammogram for malignant neoplasm of breast: Secondary | ICD-10-CM | POA: Diagnosis not present

## 2015-11-14 DIAGNOSIS — J069 Acute upper respiratory infection, unspecified: Secondary | ICD-10-CM | POA: Diagnosis not present

## 2015-11-14 DIAGNOSIS — R0600 Dyspnea, unspecified: Secondary | ICD-10-CM | POA: Diagnosis not present

## 2015-11-14 DIAGNOSIS — M47816 Spondylosis without myelopathy or radiculopathy, lumbar region: Secondary | ICD-10-CM | POA: Diagnosis not present

## 2015-11-30 DIAGNOSIS — J40 Bronchitis, not specified as acute or chronic: Secondary | ICD-10-CM | POA: Diagnosis not present

## 2016-01-23 DIAGNOSIS — M47816 Spondylosis without myelopathy or radiculopathy, lumbar region: Secondary | ICD-10-CM | POA: Diagnosis not present

## 2016-01-23 DIAGNOSIS — M47817 Spondylosis without myelopathy or radiculopathy, lumbosacral region: Secondary | ICD-10-CM | POA: Diagnosis not present

## 2016-02-11 DIAGNOSIS — J40 Bronchitis, not specified as acute or chronic: Secondary | ICD-10-CM | POA: Diagnosis not present

## 2016-02-11 DIAGNOSIS — J329 Chronic sinusitis, unspecified: Secondary | ICD-10-CM | POA: Diagnosis not present

## 2016-02-11 DIAGNOSIS — I10 Essential (primary) hypertension: Secondary | ICD-10-CM | POA: Diagnosis not present

## 2016-02-21 DIAGNOSIS — M545 Low back pain: Secondary | ICD-10-CM | POA: Diagnosis not present

## 2016-02-21 DIAGNOSIS — M5136 Other intervertebral disc degeneration, lumbar region: Secondary | ICD-10-CM | POA: Diagnosis not present

## 2016-02-21 DIAGNOSIS — M5033 Other cervical disc degeneration, cervicothoracic region: Secondary | ICD-10-CM | POA: Diagnosis not present

## 2016-02-21 DIAGNOSIS — M47816 Spondylosis without myelopathy or radiculopathy, lumbar region: Secondary | ICD-10-CM | POA: Diagnosis not present

## 2016-03-17 DIAGNOSIS — L853 Xerosis cutis: Secondary | ICD-10-CM | POA: Diagnosis not present

## 2016-03-17 DIAGNOSIS — L820 Inflamed seborrheic keratosis: Secondary | ICD-10-CM | POA: Diagnosis not present

## 2016-03-17 DIAGNOSIS — R233 Spontaneous ecchymoses: Secondary | ICD-10-CM | POA: Diagnosis not present

## 2016-03-24 DIAGNOSIS — N644 Mastodynia: Secondary | ICD-10-CM | POA: Diagnosis not present

## 2016-03-24 DIAGNOSIS — E1165 Type 2 diabetes mellitus with hyperglycemia: Secondary | ICD-10-CM | POA: Diagnosis not present

## 2016-03-24 DIAGNOSIS — E782 Mixed hyperlipidemia: Secondary | ICD-10-CM | POA: Diagnosis not present

## 2016-03-31 DIAGNOSIS — N644 Mastodynia: Secondary | ICD-10-CM | POA: Diagnosis not present

## 2016-03-31 DIAGNOSIS — N6002 Solitary cyst of left breast: Secondary | ICD-10-CM | POA: Diagnosis not present

## 2016-03-31 DIAGNOSIS — N63 Unspecified lump in breast: Secondary | ICD-10-CM | POA: Diagnosis not present

## 2016-04-22 DIAGNOSIS — J0100 Acute maxillary sinusitis, unspecified: Secondary | ICD-10-CM | POA: Diagnosis not present

## 2016-04-22 DIAGNOSIS — R0602 Shortness of breath: Secondary | ICD-10-CM | POA: Diagnosis not present

## 2016-04-22 DIAGNOSIS — R0600 Dyspnea, unspecified: Secondary | ICD-10-CM | POA: Diagnosis not present

## 2016-04-22 DIAGNOSIS — R05 Cough: Secondary | ICD-10-CM | POA: Diagnosis not present

## 2016-05-06 DIAGNOSIS — J4521 Mild intermittent asthma with (acute) exacerbation: Secondary | ICD-10-CM | POA: Diagnosis not present

## 2016-06-16 DIAGNOSIS — Z86010 Personal history of colonic polyps: Secondary | ICD-10-CM | POA: Diagnosis not present

## 2016-06-16 DIAGNOSIS — K219 Gastro-esophageal reflux disease without esophagitis: Secondary | ICD-10-CM | POA: Diagnosis not present

## 2016-06-16 DIAGNOSIS — Z01818 Encounter for other preprocedural examination: Secondary | ICD-10-CM | POA: Diagnosis not present

## 2016-07-01 DIAGNOSIS — Z7984 Long term (current) use of oral hypoglycemic drugs: Secondary | ICD-10-CM | POA: Diagnosis not present

## 2016-07-01 DIAGNOSIS — Z79899 Other long term (current) drug therapy: Secondary | ICD-10-CM | POA: Diagnosis not present

## 2016-07-01 DIAGNOSIS — Z7982 Long term (current) use of aspirin: Secondary | ICD-10-CM | POA: Diagnosis not present

## 2016-07-01 DIAGNOSIS — I10 Essential (primary) hypertension: Secondary | ICD-10-CM | POA: Diagnosis not present

## 2016-07-01 DIAGNOSIS — Z800 Family history of malignant neoplasm of digestive organs: Secondary | ICD-10-CM | POA: Diagnosis not present

## 2016-07-01 DIAGNOSIS — E119 Type 2 diabetes mellitus without complications: Secondary | ICD-10-CM | POA: Diagnosis not present

## 2016-07-01 DIAGNOSIS — Z86010 Personal history of colonic polyps: Secondary | ICD-10-CM | POA: Diagnosis not present

## 2016-07-02 DIAGNOSIS — J0100 Acute maxillary sinusitis, unspecified: Secondary | ICD-10-CM | POA: Diagnosis not present

## 2016-07-06 DIAGNOSIS — R030 Elevated blood-pressure reading, without diagnosis of hypertension: Secondary | ICD-10-CM | POA: Diagnosis not present

## 2016-07-06 DIAGNOSIS — I4891 Unspecified atrial fibrillation: Secondary | ICD-10-CM | POA: Diagnosis not present

## 2016-07-06 DIAGNOSIS — R000 Tachycardia, unspecified: Secondary | ICD-10-CM | POA: Diagnosis not present

## 2016-07-18 DIAGNOSIS — E785 Hyperlipidemia, unspecified: Secondary | ICD-10-CM | POA: Diagnosis not present

## 2016-07-18 DIAGNOSIS — Z6833 Body mass index (BMI) 33.0-33.9, adult: Secondary | ICD-10-CM | POA: Diagnosis not present

## 2016-07-18 DIAGNOSIS — I480 Paroxysmal atrial fibrillation: Secondary | ICD-10-CM | POA: Insufficient documentation

## 2016-07-18 DIAGNOSIS — I25119 Atherosclerotic heart disease of native coronary artery with unspecified angina pectoris: Secondary | ICD-10-CM | POA: Diagnosis not present

## 2016-07-18 DIAGNOSIS — I10 Essential (primary) hypertension: Secondary | ICD-10-CM | POA: Diagnosis not present

## 2016-07-18 HISTORY — DX: Paroxysmal atrial fibrillation: I48.0

## 2016-07-24 DIAGNOSIS — I480 Paroxysmal atrial fibrillation: Secondary | ICD-10-CM | POA: Diagnosis not present

## 2016-07-29 DIAGNOSIS — I480 Paroxysmal atrial fibrillation: Secondary | ICD-10-CM | POA: Diagnosis not present

## 2016-08-12 DIAGNOSIS — Z23 Encounter for immunization: Secondary | ICD-10-CM | POA: Diagnosis not present

## 2016-09-06 DIAGNOSIS — J209 Acute bronchitis, unspecified: Secondary | ICD-10-CM | POA: Diagnosis not present

## 2016-09-17 DIAGNOSIS — J329 Chronic sinusitis, unspecified: Secondary | ICD-10-CM | POA: Diagnosis not present

## 2016-09-17 DIAGNOSIS — J40 Bronchitis, not specified as acute or chronic: Secondary | ICD-10-CM | POA: Diagnosis not present

## 2016-09-25 DIAGNOSIS — H6981 Other specified disorders of Eustachian tube, right ear: Secondary | ICD-10-CM | POA: Diagnosis not present

## 2016-09-25 DIAGNOSIS — J0120 Acute ethmoidal sinusitis, unspecified: Secondary | ICD-10-CM | POA: Diagnosis not present

## 2016-10-07 DIAGNOSIS — E1165 Type 2 diabetes mellitus with hyperglycemia: Secondary | ICD-10-CM | POA: Diagnosis not present

## 2016-10-07 DIAGNOSIS — I10 Essential (primary) hypertension: Secondary | ICD-10-CM | POA: Diagnosis not present

## 2016-10-07 DIAGNOSIS — J40 Bronchitis, not specified as acute or chronic: Secondary | ICD-10-CM | POA: Diagnosis not present

## 2016-10-07 DIAGNOSIS — Z9181 History of falling: Secondary | ICD-10-CM | POA: Diagnosis not present

## 2016-11-11 DIAGNOSIS — Z1231 Encounter for screening mammogram for malignant neoplasm of breast: Secondary | ICD-10-CM | POA: Diagnosis not present

## 2016-11-14 DIAGNOSIS — J019 Acute sinusitis, unspecified: Secondary | ICD-10-CM | POA: Diagnosis not present

## 2016-12-10 DIAGNOSIS — R252 Cramp and spasm: Secondary | ICD-10-CM | POA: Diagnosis not present

## 2016-12-10 DIAGNOSIS — E1165 Type 2 diabetes mellitus with hyperglycemia: Secondary | ICD-10-CM | POA: Diagnosis not present

## 2016-12-10 DIAGNOSIS — I10 Essential (primary) hypertension: Secondary | ICD-10-CM | POA: Diagnosis not present

## 2016-12-11 DIAGNOSIS — L820 Inflamed seborrheic keratosis: Secondary | ICD-10-CM | POA: Diagnosis not present

## 2017-01-05 DIAGNOSIS — J0100 Acute maxillary sinusitis, unspecified: Secondary | ICD-10-CM | POA: Diagnosis not present

## 2017-01-26 DIAGNOSIS — R1314 Dysphagia, pharyngoesophageal phase: Secondary | ICD-10-CM

## 2017-01-26 DIAGNOSIS — M545 Low back pain, unspecified: Secondary | ICD-10-CM | POA: Insufficient documentation

## 2017-01-26 DIAGNOSIS — Z96659 Presence of unspecified artificial knee joint: Secondary | ICD-10-CM

## 2017-01-26 DIAGNOSIS — M940 Chondrocostal junction syndrome [Tietze]: Secondary | ICD-10-CM

## 2017-01-26 DIAGNOSIS — B029 Zoster without complications: Secondary | ICD-10-CM | POA: Insufficient documentation

## 2017-01-26 DIAGNOSIS — G5600 Carpal tunnel syndrome, unspecified upper limb: Secondary | ICD-10-CM | POA: Insufficient documentation

## 2017-01-26 DIAGNOSIS — J320 Chronic maxillary sinusitis: Secondary | ICD-10-CM

## 2017-01-26 DIAGNOSIS — H6981 Other specified disorders of Eustachian tube, right ear: Secondary | ICD-10-CM

## 2017-01-26 DIAGNOSIS — E039 Hypothyroidism, unspecified: Secondary | ICD-10-CM | POA: Insufficient documentation

## 2017-01-26 DIAGNOSIS — F419 Anxiety disorder, unspecified: Secondary | ICD-10-CM

## 2017-01-26 DIAGNOSIS — Z860100 Personal history of colon polyps, unspecified: Secondary | ICD-10-CM

## 2017-01-26 DIAGNOSIS — B370 Candidal stomatitis: Secondary | ICD-10-CM | POA: Insufficient documentation

## 2017-01-26 DIAGNOSIS — M810 Age-related osteoporosis without current pathological fracture: Secondary | ICD-10-CM

## 2017-01-26 DIAGNOSIS — G8929 Other chronic pain: Secondary | ICD-10-CM | POA: Insufficient documentation

## 2017-01-26 DIAGNOSIS — R05 Cough: Secondary | ICD-10-CM | POA: Insufficient documentation

## 2017-01-26 DIAGNOSIS — Z8659 Personal history of other mental and behavioral disorders: Secondary | ICD-10-CM | POA: Insufficient documentation

## 2017-01-26 DIAGNOSIS — E663 Overweight: Secondary | ICD-10-CM | POA: Insufficient documentation

## 2017-01-26 DIAGNOSIS — T8459XA Infection and inflammatory reaction due to other internal joint prosthesis, initial encounter: Secondary | ICD-10-CM | POA: Insufficient documentation

## 2017-01-26 DIAGNOSIS — B0222 Postherpetic trigeminal neuralgia: Secondary | ICD-10-CM | POA: Insufficient documentation

## 2017-01-26 DIAGNOSIS — G4733 Obstructive sleep apnea (adult) (pediatric): Secondary | ICD-10-CM | POA: Insufficient documentation

## 2017-01-26 DIAGNOSIS — M722 Plantar fascial fibromatosis: Secondary | ICD-10-CM | POA: Insufficient documentation

## 2017-01-26 DIAGNOSIS — G4700 Insomnia, unspecified: Secondary | ICD-10-CM

## 2017-01-26 DIAGNOSIS — H6991 Unspecified Eustachian tube disorder, right ear: Secondary | ICD-10-CM

## 2017-01-26 DIAGNOSIS — J310 Chronic rhinitis: Secondary | ICD-10-CM | POA: Insufficient documentation

## 2017-01-26 DIAGNOSIS — M1990 Unspecified osteoarthritis, unspecified site: Secondary | ICD-10-CM | POA: Insufficient documentation

## 2017-01-26 DIAGNOSIS — R61 Generalized hyperhidrosis: Secondary | ICD-10-CM

## 2017-01-26 DIAGNOSIS — J329 Chronic sinusitis, unspecified: Secondary | ICD-10-CM

## 2017-01-26 DIAGNOSIS — L258 Unspecified contact dermatitis due to other agents: Secondary | ICD-10-CM | POA: Insufficient documentation

## 2017-01-26 DIAGNOSIS — R062 Wheezing: Secondary | ICD-10-CM | POA: Insufficient documentation

## 2017-01-26 DIAGNOSIS — K2100 Gastro-esophageal reflux disease with esophagitis, without bleeding: Secondary | ICD-10-CM

## 2017-01-26 DIAGNOSIS — E782 Mixed hyperlipidemia: Secondary | ICD-10-CM

## 2017-01-26 DIAGNOSIS — E1165 Type 2 diabetes mellitus with hyperglycemia: Secondary | ICD-10-CM

## 2017-01-26 DIAGNOSIS — B3781 Candidal esophagitis: Secondary | ICD-10-CM

## 2017-01-26 DIAGNOSIS — E559 Vitamin D deficiency, unspecified: Secondary | ICD-10-CM

## 2017-01-26 DIAGNOSIS — R059 Cough, unspecified: Secondary | ICD-10-CM | POA: Insufficient documentation

## 2017-01-26 DIAGNOSIS — N644 Mastodynia: Secondary | ICD-10-CM | POA: Insufficient documentation

## 2017-01-26 DIAGNOSIS — L723 Sebaceous cyst: Secondary | ICD-10-CM | POA: Insufficient documentation

## 2017-01-26 DIAGNOSIS — G4734 Idiopathic sleep related nonobstructive alveolar hypoventilation: Secondary | ICD-10-CM | POA: Insufficient documentation

## 2017-01-26 DIAGNOSIS — K317 Polyp of stomach and duodenum: Secondary | ICD-10-CM

## 2017-01-26 DIAGNOSIS — Z8619 Personal history of other infectious and parasitic diseases: Secondary | ICD-10-CM | POA: Insufficient documentation

## 2017-01-26 DIAGNOSIS — N6019 Diffuse cystic mastopathy of unspecified breast: Secondary | ICD-10-CM

## 2017-01-26 DIAGNOSIS — G5601 Carpal tunnel syndrome, right upper limb: Secondary | ICD-10-CM | POA: Insufficient documentation

## 2017-01-26 DIAGNOSIS — J45909 Unspecified asthma, uncomplicated: Secondary | ICD-10-CM | POA: Insufficient documentation

## 2017-01-26 HISTORY — DX: Diffuse cystic mastopathy of unspecified breast: N60.19

## 2017-01-26 HISTORY — DX: Other specified disorders of eustachian tube, right ear: H69.81

## 2017-01-26 HISTORY — DX: Cough, unspecified: R05.9

## 2017-01-26 HISTORY — DX: Presence of unspecified artificial knee joint: Z96.659

## 2017-01-26 HISTORY — DX: Personal history of colonic polyps: Z86.010

## 2017-01-26 HISTORY — DX: Wheezing: R06.2

## 2017-01-26 HISTORY — DX: Low back pain, unspecified: M54.50

## 2017-01-26 HISTORY — DX: Zoster without complications: B02.9

## 2017-01-26 HISTORY — DX: Unspecified asthma, uncomplicated: J45.909

## 2017-01-26 HISTORY — DX: Insomnia, unspecified: G47.00

## 2017-01-26 HISTORY — DX: Chronic sinusitis, unspecified: J32.9

## 2017-01-26 HISTORY — DX: Dysphagia, pharyngoesophageal phase: R13.14

## 2017-01-26 HISTORY — DX: Carpal tunnel syndrome, right upper limb: G56.01

## 2017-01-26 HISTORY — DX: Chronic maxillary sinusitis: J32.0

## 2017-01-26 HISTORY — DX: Personal history of other infectious and parasitic diseases: Z86.19

## 2017-01-26 HISTORY — DX: Chronic rhinitis: J31.0

## 2017-01-26 HISTORY — DX: Generalized hyperhidrosis: R61

## 2017-01-26 HISTORY — DX: Overweight: E66.3

## 2017-01-26 HISTORY — DX: Anxiety disorder, unspecified: F41.9

## 2017-01-26 HISTORY — DX: Other chronic pain: G89.29

## 2017-01-26 HISTORY — DX: Mixed hyperlipidemia: E78.2

## 2017-01-26 HISTORY — DX: Age-related osteoporosis without current pathological fracture: M81.0

## 2017-01-26 HISTORY — DX: Idiopathic sleep related nonobstructive alveolar hypoventilation: G47.34

## 2017-01-26 HISTORY — DX: Unspecified contact dermatitis due to other agents: L25.8

## 2017-01-26 HISTORY — DX: Personal history of other mental and behavioral disorders: Z86.59

## 2017-01-26 HISTORY — DX: Mastodynia: N64.4

## 2017-01-26 HISTORY — DX: Chondrocostal junction syndrome (tietze): M94.0

## 2017-01-26 HISTORY — DX: Sebaceous cyst: L72.3

## 2017-01-26 HISTORY — DX: Infection and inflammatory reaction due to other internal joint prosthesis, initial encounter: T84.59XA

## 2017-01-26 HISTORY — DX: Personal history of colon polyps, unspecified: Z86.0100

## 2017-01-26 HISTORY — DX: Type 2 diabetes mellitus with hyperglycemia: E11.65

## 2017-01-26 HISTORY — DX: Hypothyroidism, unspecified: E03.9

## 2017-01-26 HISTORY — DX: Polyp of stomach and duodenum: K31.7

## 2017-01-26 HISTORY — DX: Postherpetic trigeminal neuralgia: B02.22

## 2017-01-26 HISTORY — DX: Vitamin D deficiency, unspecified: E55.9

## 2017-01-26 HISTORY — DX: Carpal tunnel syndrome, unspecified upper limb: G56.00

## 2017-01-26 HISTORY — DX: Candidal stomatitis: B37.0

## 2017-01-26 HISTORY — DX: Unspecified eustachian tube disorder, right ear: H69.91

## 2017-01-26 HISTORY — DX: Unspecified osteoarthritis, unspecified site: M19.90

## 2017-01-26 HISTORY — DX: Gastro-esophageal reflux disease with esophagitis, without bleeding: K21.00

## 2017-01-26 HISTORY — DX: Plantar fascial fibromatosis: M72.2

## 2017-01-29 DIAGNOSIS — D1801 Hemangioma of skin and subcutaneous tissue: Secondary | ICD-10-CM | POA: Diagnosis not present

## 2017-01-29 DIAGNOSIS — D2239 Melanocytic nevi of other parts of face: Secondary | ICD-10-CM | POA: Diagnosis not present

## 2017-01-29 DIAGNOSIS — D225 Melanocytic nevi of trunk: Secondary | ICD-10-CM | POA: Diagnosis not present

## 2017-01-29 DIAGNOSIS — L820 Inflamed seborrheic keratosis: Secondary | ICD-10-CM | POA: Diagnosis not present

## 2017-01-29 DIAGNOSIS — L821 Other seborrheic keratosis: Secondary | ICD-10-CM | POA: Diagnosis not present

## 2017-04-14 DIAGNOSIS — Z23 Encounter for immunization: Secondary | ICD-10-CM | POA: Diagnosis not present

## 2017-04-29 DIAGNOSIS — J329 Chronic sinusitis, unspecified: Secondary | ICD-10-CM | POA: Diagnosis not present

## 2017-04-29 DIAGNOSIS — R5381 Other malaise: Secondary | ICD-10-CM | POA: Diagnosis not present

## 2017-04-29 DIAGNOSIS — E559 Vitamin D deficiency, unspecified: Secondary | ICD-10-CM | POA: Diagnosis not present

## 2017-04-29 DIAGNOSIS — Z0000 Encounter for general adult medical examination without abnormal findings: Secondary | ICD-10-CM | POA: Diagnosis not present

## 2017-04-29 DIAGNOSIS — I10 Essential (primary) hypertension: Secondary | ICD-10-CM | POA: Diagnosis not present

## 2017-04-29 DIAGNOSIS — Z5329 Procedure and treatment not carried out because of patient's decision for other reasons: Secondary | ICD-10-CM | POA: Diagnosis not present

## 2017-04-29 DIAGNOSIS — E782 Mixed hyperlipidemia: Secondary | ICD-10-CM | POA: Diagnosis not present

## 2017-04-29 DIAGNOSIS — E1165 Type 2 diabetes mellitus with hyperglycemia: Secondary | ICD-10-CM | POA: Diagnosis not present

## 2017-04-29 DIAGNOSIS — R5383 Other fatigue: Secondary | ICD-10-CM | POA: Diagnosis not present

## 2017-06-18 DIAGNOSIS — L811 Chloasma: Secondary | ICD-10-CM | POA: Diagnosis not present

## 2017-06-18 DIAGNOSIS — L814 Other melanin hyperpigmentation: Secondary | ICD-10-CM | POA: Diagnosis not present

## 2017-06-18 DIAGNOSIS — L578 Other skin changes due to chronic exposure to nonionizing radiation: Secondary | ICD-10-CM | POA: Diagnosis not present

## 2017-06-18 DIAGNOSIS — L570 Actinic keratosis: Secondary | ICD-10-CM | POA: Diagnosis not present

## 2017-06-18 DIAGNOSIS — L821 Other seborrheic keratosis: Secondary | ICD-10-CM | POA: Diagnosis not present

## 2017-06-23 ENCOUNTER — Ambulatory Visit (INDEPENDENT_AMBULATORY_CARE_PROVIDER_SITE_OTHER): Payer: Medicare Other | Admitting: Podiatry

## 2017-06-23 DIAGNOSIS — L989 Disorder of the skin and subcutaneous tissue, unspecified: Secondary | ICD-10-CM | POA: Diagnosis not present

## 2017-06-23 DIAGNOSIS — Q828 Other specified congenital malformations of skin: Secondary | ICD-10-CM | POA: Diagnosis not present

## 2017-06-28 NOTE — Progress Notes (Signed)
  Subjective:  Patient ID: Patricia Dawson, female    DOB: 22-Aug-1940,  MRN: [id number]  Chief Complaint  Patient presents with  . Diabetes    Diabetic   . Plantar Fasciitis    fibroma in bottom of left foot did not shrink Dr Prudence Davidson told her she could have another shot if she needed to help it shrink    76 y.o. female returns for the above complaint.  States that she was told she had a plantar fibroma and that it would shrink after she received an injection however did not do so.  Objective:  There were no vitals filed for this visit. General AA&O x3. Normal mood and affect.  Vascular Pedal pulses palpable.  Neurologic Epicritic sensation grossly intact.  Dermatologic No open lesions. Skin normal texture and turgor.  Punctate hyperkeratotic lesion plantar left forefoot.  Orthopedic: Pain to palpation left forefoot lesion.  No palpable nodules along the plantar fascia   Assessment & Plan:  Patient was evaluated and treated and all questions answered.  Porokeratosis left foot -Discussed likely porokeratosis / blocked sweat duct -Discussed possible punch biopsy. Patient would like to proceed. Will plan for next week.

## 2017-06-29 ENCOUNTER — Ambulatory Visit (INDEPENDENT_AMBULATORY_CARE_PROVIDER_SITE_OTHER): Payer: Medicare Other | Admitting: Podiatry

## 2017-06-29 DIAGNOSIS — M795 Residual foreign body in soft tissue: Secondary | ICD-10-CM | POA: Diagnosis not present

## 2017-06-29 DIAGNOSIS — L989 Disorder of the skin and subcutaneous tissue, unspecified: Secondary | ICD-10-CM

## 2017-06-29 DIAGNOSIS — L905 Scar conditions and fibrosis of skin: Secondary | ICD-10-CM | POA: Diagnosis not present

## 2017-06-29 DIAGNOSIS — B351 Tinea unguium: Secondary | ICD-10-CM | POA: Diagnosis not present

## 2017-06-29 DIAGNOSIS — L308 Other specified dermatitis: Secondary | ICD-10-CM | POA: Diagnosis not present

## 2017-06-30 ENCOUNTER — Telehealth: Payer: Self-pay | Admitting: Podiatry

## 2017-06-30 NOTE — Telephone Encounter (Signed)
Patient is having bleeding when she puts any weight on her left foot.

## 2017-06-30 NOTE — Telephone Encounter (Signed)
Called patient she stated that she had removed the purple compressive wrap early this morning when the bleeding occurred.  She stated that she has stayed off of it almost all day and it seems to be better but has just recently started hurting.  Recommended she take Tylenol or Advil to alleviate the pain.  Told her she needs reapply the purple wrap as that it was the compressive dressing to keep it from bleeding and keep the bandage in place for the full 48 hours Dr Erenest Rasher recommended.  If she has any additional bleeding she may add more gauze.  Patient stated understanding.  Advised patient to call the office with any additional questions or concerns.

## 2017-07-03 NOTE — Progress Notes (Signed)
  Subjective:  Patient ID: Patricia Dawson, female    DOB: [birth year],  MRN: [id number]  Chief Complaint  Patient presents with  . Foot Pain    fu to have biopsy of spot in the center of left ball   . Nail Problem    nail on right big toe doesn't look right    76 y.o. female returns for the above complaint.  Here for punch biopsy of her painful lesion.  Objective:  There were no vitals filed for this visit. General AA&O x3. Normal mood and affect.  Vascular Pedal pulses palpable.  Neurologic Epicritic sensation grossly intact.  Dermatologic Punctate hyperkeratotic lesion to the plantar left forefoot Brown-black nail discoloration noted  Orthopedic: Pain to palpation about the lesion   Assessment & Plan:  Patient was evaluated and treated and all questions answered.  Porokeratosis left foot -Punch biopsy as below. -Educated on postop care  Procedure: Punch Biopsy Indication: benign skin lesion Anesthesia: 2 ccs Lidocaine 1% with Epinephrine Instrumentation: 2 mm punch biopsy Technique: Sharp excisional debridement to bleeding, viable wound base.  Dressing: Dry, sterile, compression dressing. Specimen: Labeled and sent for pathology. Disposition: Patient tolerated procedure well. Leave dressing on for 48 hours. Thereafter dress with antibiotic ointment and band-aid. Off-loading pads dispensed. Patient to return in 1 week for follow-up.  Onychomycosis -Nail sample taken and sent for pathology and histology

## 2017-07-21 ENCOUNTER — Ambulatory Visit (INDEPENDENT_AMBULATORY_CARE_PROVIDER_SITE_OTHER): Payer: Medicare Other | Admitting: Podiatry

## 2017-07-21 DIAGNOSIS — L989 Disorder of the skin and subcutaneous tissue, unspecified: Secondary | ICD-10-CM

## 2017-07-21 DIAGNOSIS — B351 Tinea unguium: Secondary | ICD-10-CM

## 2017-07-21 MED ORDER — CICLOPIROX 8 % EX SOLN: Freq: Every day | CUTANEOUS | 0 refills | Status: DC

## 2017-07-21 NOTE — Progress Notes (Signed)
Subjective:  Patient ID: Patricia Dawson, female    DOB: [birth year],  MRN: [id number]  Chief Complaint  Patient presents with  . Foot Problem    biopsy results    76 y.o. female presents with the above complaint.  States the biopsy site is still sore and she feels like she has a knot in it.  Also would like to discuss nail culture results  Past Medical History:  Diagnosis Date  . Arthritis    "right knee"  (03/07/2013)  . Coronary artery disease   . GERD (gastroesophageal reflux disease)   . Hyperlipemia   . Hypertension   . OSA (obstructive sleep apnea)    "tried off and on for several years to wear mask; I can't" (03/07/2013)  . PONV (postoperative nausea and vomiting)   . Sinus headache    "often" (03/07/2013)  . Type II diabetes mellitus ([place])    Past Surgical History:  Procedure Laterality Date  . AXILLARY LYMPH NODE DISSECTION Right   . CHOLECYSTECTOMY    . CORONARY ANGIOPLASTY    . CORONARY ARTERY BYPASS GRAFT  2009   "CABG X4" (03/07/2013)  . MEDIAL PARTIAL KNEE REPLACEMENT Right 03/07/2013  . MEDIAL PARTIAL KNEE REPLACEMENT Right 03/07/2013   Procedure: MEDIAL PARTIAL KNEE REPLACEMENT;  Surgeon: Vickey Huger, MD;  Location: [place];  Service: Orthopedics;  Laterality: Right;  . TONSILLECTOMY AND ADENOIDECTOMY  ~ 1968  . VAGINAL HYSTERECTOMY      Current Outpatient Medications:  .  aspirin 81 MG tablet, Take 81 mg by mouth daily., Disp: , Rfl:  .  cholecalciferol (VITAMIN D) 1000 UNITS tablet, Take 1,000 Units by mouth daily., Disp: , Rfl:  .  ciclopirox (PENLAC) 8 % solution, Apply topically at bedtime. Apply over nail and surrounding skin. Apply daily over previous coat. After seven (7) days, may remove with alcohol and continue cycle., Disp: 6.6 mL, Rfl: 0 .  Cyanocobalamin (VITAMIN B 12 PO), Take by mouth., Disp: , Rfl:  .  gabapentin (NEURONTIN) 300 MG capsule, Take 300 mg by mouth 2 (two) times daily., Disp: , Rfl:  .  lisinopril-hydrochlorothiazide (PRINZIDE,ZESTORETIC)  20-25 MG per tablet, Take 1 tablet by mouth daily., Disp: , Rfl:  .  metFORMIN (GLUCOPHAGE-XR) 500 MG 24 hr tablet, Take 500 mg by mouth 2 (two) times daily., Disp: , Rfl:  .  metoprolol (LOPRESSOR) 50 MG tablet, Take 50 mg by mouth 2 (two) times daily., Disp: , Rfl:  .  nitroGLYCERIN (NITROSTAT) 0.4 MG SL tablet, Place 0.4 mg under the tongue., Disp: , Rfl:  .  omeprazole (PRILOSEC) 20 MG capsule, Take 20 mg by mouth 2 (two) times daily., Disp: , Rfl:  .  traZODone (DESYREL) 100 MG tablet, Take 100 mg by mouth at bedtime., Disp: , Rfl:  .  VITAMIN D, ERGOCALCIFEROL, PO, Take by mouth., Disp: , Rfl:   Allergies  Allergen Reactions  . Codeine Other (See Comments) and Nausea Only    Lightheaded, nausea, breaks out in a sweat  . Darvocet [Propoxyphene N-Acetaminophen] Other (See Comments)    Lightheaded, nausea, breaks out in a sweat  . Percocet [Oxycodone-Acetaminophen] Other (See Comments)    Lightheaded, nausea, breaks out in a sweat  . Tramadol Other (See Comments)    Lightheaded, nausea, breaks out in a sweat   Review of Systems Objective:  There were no vitals filed for this visit. General AA&O x3. Normal mood and affect.  Vascular Dorsalis pedis and posterior tibial pulses  present 2+ bilaterally  Capillary refill normal to all digits. Pedal hair growth normal.  Neurologic Epicritic sensation grossly present.  Dermatologic No open lesions. Interspaces clear of maceration. Nails well groomed and normal in appearance.  Orthopedic: MMT 5/5 in dorsiflexion, plantarflexion, inversion, and eversion. Normal joint ROM without pain or crepitus.   Assessment & Plan:  Patient was evaluated and treated and all questions answered.  Benign skin lesion -Path reviewed with patient evidence of possible foreign material foreign body reaction  Onychomycosis -Nail pathology reviewed with patient.  Recommend topical ciclopirox  Return in about 3 weeks (around 08/11/2017).

## 2017-07-23 ENCOUNTER — Telehealth: Payer: Self-pay | Admitting: Podiatry

## 2017-07-23 DIAGNOSIS — S8011XA Contusion of right lower leg, initial encounter: Secondary | ICD-10-CM | POA: Diagnosis not present

## 2017-07-23 DIAGNOSIS — J329 Chronic sinusitis, unspecified: Secondary | ICD-10-CM | POA: Diagnosis not present

## 2017-07-23 NOTE — Telephone Encounter (Signed)
Dr March Rummage, please advise.

## 2017-07-23 NOTE — Telephone Encounter (Signed)
I saw Dr. March Rummage on Tuesday and he said he was going to send a prescription to my pharmacy which is [facility] [place]. They did not receive  the prescription yet. My phone number is [phone]. Thank you. Bye.

## 2017-07-24 ENCOUNTER — Telehealth: Payer: Self-pay | Admitting: Podiatry

## 2017-07-24 NOTE — Telephone Encounter (Signed)
I saw Dr. March Rummage on Tuesday and he prescribed something for me and the prescription is supposed to go to [facility] in [place] and they have not received it yet. So I was hoping someone could check on that for me. Thank you so much and have a good Christmas. Bye.

## 2017-07-31 ENCOUNTER — Telehealth: Payer: Self-pay | Admitting: Podiatry

## 2017-07-31 MED ORDER — CICLOPIROX 8 % EX SOLN: Freq: Every day | CUTANEOUS | 0 refills | Status: DC

## 2017-07-31 NOTE — Addendum Note (Signed)
Addended by: Harriett Sine D on: 07/31/2017 01:49 PM   Modules accepted: Orders

## 2017-07-31 NOTE — Telephone Encounter (Signed)
I saw Dr. March Rummage sometime back and he was supposed to send a medication for toenail fungus to the [facility] in [place] and they have not seen anything yet. If someone could please check on that for me. My number is [phone]. Thank you.

## 2017-07-31 NOTE — Telephone Encounter (Signed)
I informed pt Dr. March Rummage had ordered the [place], but the order prompt had been for [facility], and it was not sent to the [facility], but remained in office. I told pt I would fax the rx to [facility]. Faxed rx to [facility].

## 2017-08-17 ENCOUNTER — Ambulatory Visit (INDEPENDENT_AMBULATORY_CARE_PROVIDER_SITE_OTHER): Payer: Medicare Other | Admitting: Podiatry

## 2017-08-17 DIAGNOSIS — Q828 Other specified congenital malformations of skin: Secondary | ICD-10-CM | POA: Diagnosis not present

## 2017-08-17 DIAGNOSIS — L989 Disorder of the skin and subcutaneous tissue, unspecified: Secondary | ICD-10-CM

## 2017-08-17 DIAGNOSIS — B351 Tinea unguium: Secondary | ICD-10-CM

## 2017-08-17 MED ORDER — CICLOPIROX 8 % EX SOLN: Freq: Every day | CUTANEOUS | 0 refills | Status: DC

## 2017-08-17 NOTE — Progress Notes (Signed)
  Subjective:  Patient ID: Patricia Dawson, female    DOB: Jul 19, 1941,  MRN: [id number]  No chief complaint on file.  77 y.o. female returns for the above complaint.  No pain in both feet today.  States she never got the Rx for ciclopirox would like a printed copy of it today.  Objective:  There were no vitals filed for this visit. General AA&O x3. Normal mood and affect.  Vascular Pedal pulses palpable.  Neurologic Epicritic sensation grossly intact.  Dermatologic No open lesions. Skin normal texture and turgor. Small punctate keratosis R forefoot  Orthopedic: No pain to palpation either foot.   Assessment & Plan:  Patient was evaluated and treated and all questions answered.  Onychomycosis -Reprint Rx for Ciclopirox  Porokeratosis -Minimal debridement today.  Return if symptoms worsen or fail to improve.

## 2017-09-01 ENCOUNTER — Telehealth: Payer: Self-pay | Admitting: *Deleted

## 2017-09-01 NOTE — Telephone Encounter (Signed)
Received denial letter for Ciclopirox 8%.

## 2017-09-01 NOTE — Telephone Encounter (Signed)
Completed required Request for Medicare Prescription Drug Coverage Determination form and faxed to [facility].

## 2017-09-01 NOTE — Telephone Encounter (Signed)
[facility] computer call request pt call [phone]. I informed pt of the [facility] request and gave call back number.

## 2017-09-01 NOTE — Telephone Encounter (Addendum)
[employer] approve 13.34ml for 90 days supply.

## 2017-09-08 DIAGNOSIS — G8929 Other chronic pain: Secondary | ICD-10-CM | POA: Insufficient documentation

## 2017-09-08 DIAGNOSIS — M25562 Pain in left knee: Secondary | ICD-10-CM

## 2017-09-08 HISTORY — DX: Other chronic pain: G89.29

## 2017-09-08 HISTORY — DX: Pain in left knee: M25.562

## 2018-06-03 DIAGNOSIS — L209 Atopic dermatitis, unspecified: Secondary | ICD-10-CM | POA: Insufficient documentation

## 2018-06-03 HISTORY — DX: Atopic dermatitis, unspecified: L20.9

## 2018-07-15 DIAGNOSIS — J4520 Mild intermittent asthma, uncomplicated: Secondary | ICD-10-CM

## 2018-07-15 DIAGNOSIS — J4521 Mild intermittent asthma with (acute) exacerbation: Secondary | ICD-10-CM

## 2018-07-15 HISTORY — DX: Mild intermittent asthma with (acute) exacerbation: J45.21

## 2018-07-15 HISTORY — DX: Mild intermittent asthma, uncomplicated: J45.20

## 2018-08-06 DIAGNOSIS — R079 Chest pain, unspecified: Secondary | ICD-10-CM

## 2018-08-06 DIAGNOSIS — E876 Hypokalemia: Secondary | ICD-10-CM

## 2018-08-06 DIAGNOSIS — I160 Hypertensive urgency: Secondary | ICD-10-CM

## 2018-08-06 DIAGNOSIS — E871 Hypo-osmolality and hyponatremia: Secondary | ICD-10-CM

## 2018-08-06 DIAGNOSIS — E669 Obesity, unspecified: Secondary | ICD-10-CM | POA: Diagnosis not present

## 2018-08-06 DIAGNOSIS — E860 Dehydration: Secondary | ICD-10-CM

## 2018-08-06 DIAGNOSIS — E1165 Type 2 diabetes mellitus with hyperglycemia: Secondary | ICD-10-CM

## 2018-08-07 DIAGNOSIS — E860 Dehydration: Secondary | ICD-10-CM | POA: Diagnosis not present

## 2018-08-07 DIAGNOSIS — E871 Hypo-osmolality and hyponatremia: Secondary | ICD-10-CM | POA: Diagnosis not present

## 2018-08-07 DIAGNOSIS — E876 Hypokalemia: Secondary | ICD-10-CM | POA: Diagnosis not present

## 2018-08-07 DIAGNOSIS — E669 Obesity, unspecified: Secondary | ICD-10-CM | POA: Diagnosis not present

## 2018-09-06 ENCOUNTER — Ambulatory Visit: Payer: BLUE CROSS/BLUE SHIELD | Admitting: Cardiology

## 2018-09-23 ENCOUNTER — Encounter: Payer: Self-pay | Admitting: Cardiology

## 2018-09-23 ENCOUNTER — Ambulatory Visit (INDEPENDENT_AMBULATORY_CARE_PROVIDER_SITE_OTHER): Payer: Medicare Other | Admitting: Cardiology

## 2018-09-23 VITALS — BP 126/82 | HR 72 | Ht 62.5 in | Wt 190.0 lb

## 2018-09-23 DIAGNOSIS — E1165 Type 2 diabetes mellitus with hyperglycemia: Secondary | ICD-10-CM | POA: Diagnosis not present

## 2018-09-23 DIAGNOSIS — Z01810 Encounter for preprocedural cardiovascular examination: Secondary | ICD-10-CM | POA: Diagnosis not present

## 2018-09-23 DIAGNOSIS — I10 Essential (primary) hypertension: Secondary | ICD-10-CM

## 2018-09-23 DIAGNOSIS — E782 Mixed hyperlipidemia: Secondary | ICD-10-CM | POA: Diagnosis not present

## 2018-09-23 DIAGNOSIS — R9439 Abnormal result of other cardiovascular function study: Secondary | ICD-10-CM

## 2018-09-23 HISTORY — DX: Abnormal result of other cardiovascular function study: R94.39

## 2018-09-23 NOTE — Progress Notes (Addendum)
Cardiology Office Note:    Date:  09/23/2018   ID:  Patricia Dawson, Patricia Dawson November 30, 1940, MRN [id number]  PCP:  Myrlene Broker, MD  Cardiologist:  Jenean Lindau, MD   Referring MD: Myrlene Broker, MD    ASSESSMENT:    1. Preoperative cardiovascular examination   2. Combined hyperlipidemia   3. Essential hypertension   4. Type 2 diabetes mellitus with hyperglycemia, without long-term current use of insulin (HCC)   5. Abnormal nuclear stress test    PLAN:    In order of problems listed above:  1. Secondary prevention stressed with the patient.  Importance of compliance with diet and medication stressed and she vocalized understanding.  Her blood pressure is stable.  Diet was discussed for dyslipidemia and diabetes mellitus.  Weight reduction was stressed and risks of obesity explained. 2. I reviewed stress test report and it is a low risk scan.  She is totally asymptomatic though she leads a sedentary lifestyle.  She mentions to me that she has had disabling back pain when she is in bed for the past at least 1 month.  Just the fact of not having surgery brought her into tears.  She tells me that she cannot take the pain anymore.  I think this lady needs surgery very badly for her quality of life.  She is asymptomatic.  Her stress test is low risk scan.  If she needs any coronary intervention for any reason she will have to go for several weeks to months without intervention on her back and she tells me that she would not be able to live with the pain.  In view of all the above factors I think she is not at high risk for coronary events during the aforementioned surgery.  Meticulous hemodynamic monitoring and continued perioperative uninterrupted beta-blockade will further reduce the risk of coronary events.  Please do not hesitate to call us with any questions about a cardiovascular management. 3. Patient will be seen in follow-up appointment in 6 months or earlier if the patient has any  concerns   Addendum 09/24/2018 10.10 AM I reviewed the paper sent from the specialist's office.  It is my opinion that in view of mildly abnormal stress test it would be a good idea to have the patient continue a coated baby aspirin on a daily basis in the perioperative process and through the surgery.  If this is deemed to put the patient at high risk or higher risk of complications from the surgery then it may be withheld with the patient and the surgeon/care providers being aware that this puts the patient high risk of the possibility of coronary complications.    Medication Adjustments/Labs and Tests Ordered: Current medicines are reviewed at length with the patient today.  Concerns regarding medicines are outlined above.  No orders of the defined types were placed in this encounter.  No orders of the defined types were placed in this encounter.    No chief complaint on file.    History of Present Illness:    Patricia Dawson is a 78 y.o. female.  Patient has known coronary artery disease with bypass surgery in the remote past.  She has history of essential hypertension, dyslipidemia and diabetes mellitus.  She is overweight.  She is contemplating back surgery because of disabling back pain that has had impaired for the past 1 month.  When she talks about her back pain she is in tears.  She underwent stress testing  which was mildly abnormal with preserved systolic function.  At the time of my evaluation, the patient is alert awake oriented and in no distress.  She is in a wheelchair.  She mentions to me with exertion and stress that she has no chest pain and has not had some in the past several months.  Her husband agrees.  Past Medical History:  Diagnosis Date  . Arthritis    "right knee"  (03/07/2013)  . Coronary artery disease   . GERD (gastroesophageal reflux disease)   . Hyperlipemia   . Hypertension   . OSA (obstructive sleep apnea)    "tried off and on for several years to wear  mask; I can't" (03/07/2013)  . PONV (postoperative nausea and vomiting)   . Sinus headache    "often" (03/07/2013)  . Type II diabetes mellitus ([place])     Past Surgical History:  Procedure Laterality Date  . AXILLARY LYMPH NODE DISSECTION Right   . CHOLECYSTECTOMY    . CORONARY ANGIOPLASTY    . CORONARY ARTERY BYPASS GRAFT  2009   "CABG X4" (03/07/2013)  . MEDIAL PARTIAL KNEE REPLACEMENT Right 03/07/2013  . MEDIAL PARTIAL KNEE REPLACEMENT Right 03/07/2013   Procedure: MEDIAL PARTIAL KNEE REPLACEMENT;  Surgeon: Vickey Huger, MD;  Location: [place];  Service: Orthopedics;  Laterality: Right;  . TONSILLECTOMY AND ADENOIDECTOMY  ~ 1968  . VAGINAL HYSTERECTOMY      Current Medications: Current Meds  Medication Sig  . aspirin 81 MG tablet Take 81 mg by mouth daily.  . cholecalciferol (VITAMIN D) 1000 UNITS tablet Take 1,000 Units by mouth daily.  . Cyanocobalamin (VITAMIN B 12 PO) Take by mouth.  . gabapentin (NEURONTIN) 300 MG capsule Take 300 mg by mouth 2 (two) times daily.  Marland Kitchen lisinopril-hydrochlorothiazide (PRINZIDE,ZESTORETIC) 20-25 MG per tablet Take 1 tablet by mouth daily.  . metFORMIN (GLUCOPHAGE-XR) 500 MG 24 hr tablet Take 500 mg by mouth 2 (two) times daily.  . metoprolol (LOPRESSOR) 50 MG tablet Take 50 mg by mouth 2 (two) times daily.  . nitroGLYCERIN (NITROSTAT) 0.4 MG SL tablet Place 0.4 mg under the tongue.  Marland Kitchen omeprazole (PRILOSEC) 20 MG capsule Take 20 mg by mouth 2 (two) times daily.  Marland Kitchen oxyCODONE (OXY IR/ROXICODONE) 5 MG immediate release tablet Take 1 tablet by mouth as needed.  . predniSONE (DELTASONE) 20 MG tablet Take 40 mg by mouth daily.  . traZODone (DESYREL) 100 MG tablet Take 100 mg by mouth at bedtime.  Marland Kitchen VITAMIN D, ERGOCALCIFEROL, PO Take by mouth.     Allergies:   Codeine; Darvocet [propoxyphene n-acetaminophen]; Percocet [oxycodone-acetaminophen]; and Tramadol   Social History   Socioeconomic History  . Marital status: Married    Spouse name: Not on file  .  Number of children: Not on file  . Years of education: Not on file  . Highest education level: Not on file  Occupational History  . Not on file  Social Needs  . Financial resource strain: Not on file  . Food insecurity:    Worry: Not on file    Inability: Not on file  . Transportation needs:    Medical: Not on file    Non-medical: Not on file  Tobacco Use  . Smoking status: Never Smoker  . Smokeless tobacco: Never Used  Substance and Sexual Activity  . Alcohol use: No  . Drug use: No  . Sexual activity: Not Currently  Lifestyle  . Physical activity:    Days per week: Not on file  Minutes per session: Not on file  . Stress: Not on file  Relationships  . Social connections:    Talks on phone: Not on file    Gets together: Not on file    Attends religious service: Not on file    Active member of club or organization: Not on file    Attends meetings of clubs or organizations: Not on file    Relationship status: Not on file  Other Topics Concern  . Not on file  Social History Narrative  . Not on file     Family History: The patient's family history is not on file.  ROS:   Please see the history of present illness.    All other systems reviewed and are negative.  EKGs/Labs/Other Studies Reviewed:    The following studies were reviewed today: EKG reveals sinus rhythm and nonspecific ST-T changes.  Stress test report was reviewed.   Recent Labs: No results found for requested labs within last 8760 hours.  Recent Lipid Panel No results found for: CHOL, TRIG, HDL, CHOLHDL, VLDL, LDLCALC, LDLDIRECT  Physical Exam:    VS:  BP 126/82 (BP Location: Right Arm, Patient Position: Sitting, Cuff Size: Normal)   Pulse 72   Ht 5' 2.5" (1.588 m)   Wt 190 lb (86.2 kg)   SpO2 96%   BMI 34.20 kg/m     Wt Readings from Last 3 Encounters:  09/23/18 190 lb (86.2 kg)  02/22/13 200 lb 3.2 oz (90.8 kg)     GEN: Patient is in no acute distress HEENT: Normal NECK: No JVD;  No carotid bruits LYMPHATICS: No lymphadenopathy CARDIAC: Hear sounds regular, 2/6 systolic murmur at the apex. RESPIRATORY:  Clear to auscultation without rales, wheezing or rhonchi  ABDOMEN: Soft, non-tender, non-distended MUSCULOSKELETAL:  No edema; No deformity  SKIN: Warm and dry NEUROLOGIC:  Alert and oriented x 3 PSYCHIATRIC:  Normal affect   Signed, Jenean Lindau, MD  09/23/2018 11:15 AM    [place]

## 2018-09-23 NOTE — Patient Instructions (Signed)
Medication Instructions:   Your physician recommends that you continue on your current medications as directed. Please refer to the Current Medication list given to you today.   If you need a refill on your cardiac medications before your next appointment, please call your pharmacy.   Lab work:  NONE   Testing/Procedures:  NONE  Follow-Up: At [facility], you and your health needs are our priority.  As part of our continuing mission to provide you with exceptional heart care, we have created designated Provider Care Teams.  These Care Teams include your primary Cardiologist (physician) and Advanced Practice Providers (APPs -  Physician Assistants and Nurse Practitioners) who all work together to provide you with the care you need, when you need it.  . You will need a follow up appointment in 6 months.  Please call our office 2 months in advance to schedule this appointment.

## 2018-09-24 DIAGNOSIS — M5432 Sciatica, left side: Secondary | ICD-10-CM

## 2018-09-24 DIAGNOSIS — M4316 Spondylolisthesis, lumbar region: Secondary | ICD-10-CM

## 2018-09-24 DIAGNOSIS — M48062 Spinal stenosis, lumbar region with neurogenic claudication: Secondary | ICD-10-CM

## 2018-09-24 HISTORY — DX: Sciatica, left side: M54.32

## 2018-09-24 HISTORY — DX: Spondylolisthesis, lumbar region: M43.16

## 2018-09-24 HISTORY — DX: Spinal stenosis, lumbar region with neurogenic claudication: M48.062

## 2018-10-04 DIAGNOSIS — M5116 Intervertebral disc disorders with radiculopathy, lumbar region: Secondary | ICD-10-CM

## 2018-10-04 DIAGNOSIS — I639 Cerebral infarction, unspecified: Secondary | ICD-10-CM

## 2018-10-04 HISTORY — DX: Intervertebral disc disorders with radiculopathy, lumbar region: M51.16

## 2018-10-04 HISTORY — DX: Cerebral infarction, unspecified: I63.9

## 2019-01-20 DIAGNOSIS — I6930 Unspecified sequelae of cerebral infarction: Secondary | ICD-10-CM

## 2019-01-20 HISTORY — DX: Unspecified sequelae of cerebral infarction: I69.30

## 2019-03-24 ENCOUNTER — Ambulatory Visit: Payer: Medicare Other | Admitting: Cardiology

## 2019-04-27 DIAGNOSIS — Z0000 Encounter for general adult medical examination without abnormal findings: Secondary | ICD-10-CM

## 2019-04-27 DIAGNOSIS — L659 Nonscarring hair loss, unspecified: Secondary | ICD-10-CM | POA: Insufficient documentation

## 2019-04-27 HISTORY — DX: Nonscarring hair loss, unspecified: L65.9

## 2019-04-27 HISTORY — DX: Encounter for general adult medical examination without abnormal findings: Z00.00

## 2019-07-18 DIAGNOSIS — R079 Chest pain, unspecified: Secondary | ICD-10-CM | POA: Diagnosis not present

## 2019-07-18 DIAGNOSIS — I351 Nonrheumatic aortic (valve) insufficiency: Secondary | ICD-10-CM

## 2019-07-18 DIAGNOSIS — I361 Nonrheumatic tricuspid (valve) insufficiency: Secondary | ICD-10-CM | POA: Diagnosis not present

## 2019-07-18 DIAGNOSIS — E785 Hyperlipidemia, unspecified: Secondary | ICD-10-CM | POA: Diagnosis not present

## 2019-07-18 DIAGNOSIS — I509 Heart failure, unspecified: Secondary | ICD-10-CM

## 2019-07-18 DIAGNOSIS — I2510 Atherosclerotic heart disease of native coronary artery without angina pectoris: Secondary | ICD-10-CM

## 2019-07-18 DIAGNOSIS — E1165 Type 2 diabetes mellitus with hyperglycemia: Secondary | ICD-10-CM

## 2019-07-19 DIAGNOSIS — R079 Chest pain, unspecified: Secondary | ICD-10-CM

## 2019-07-19 DIAGNOSIS — E785 Hyperlipidemia, unspecified: Secondary | ICD-10-CM | POA: Diagnosis not present

## 2019-07-19 DIAGNOSIS — I509 Heart failure, unspecified: Secondary | ICD-10-CM | POA: Diagnosis not present

## 2019-07-19 DIAGNOSIS — I2510 Atherosclerotic heart disease of native coronary artery without angina pectoris: Secondary | ICD-10-CM | POA: Diagnosis not present

## 2019-07-20 DIAGNOSIS — I509 Heart failure, unspecified: Secondary | ICD-10-CM | POA: Diagnosis not present

## 2019-07-20 DIAGNOSIS — E785 Hyperlipidemia, unspecified: Secondary | ICD-10-CM | POA: Diagnosis not present

## 2019-07-20 DIAGNOSIS — R079 Chest pain, unspecified: Secondary | ICD-10-CM | POA: Diagnosis not present

## 2019-07-20 DIAGNOSIS — I2510 Atherosclerotic heart disease of native coronary artery without angina pectoris: Secondary | ICD-10-CM | POA: Diagnosis not present

## 2019-07-26 DIAGNOSIS — Z09 Encounter for follow-up examination after completed treatment for conditions other than malignant neoplasm: Secondary | ICD-10-CM

## 2019-07-26 HISTORY — DX: Encounter for follow-up examination after completed treatment for conditions other than malignant neoplasm: Z09

## 2019-08-16 ENCOUNTER — Other Ambulatory Visit: Payer: Self-pay

## 2019-08-16 ENCOUNTER — Encounter: Payer: Self-pay | Admitting: Cardiology

## 2019-08-16 ENCOUNTER — Ambulatory Visit (INDEPENDENT_AMBULATORY_CARE_PROVIDER_SITE_OTHER): Payer: Medicare Other | Admitting: Cardiology

## 2019-08-16 VITALS — BP 142/80 | HR 85 | Ht 62.5 in | Wt 184.0 lb

## 2019-08-16 DIAGNOSIS — I509 Heart failure, unspecified: Secondary | ICD-10-CM | POA: Insufficient documentation

## 2019-08-16 DIAGNOSIS — I5032 Chronic diastolic (congestive) heart failure: Secondary | ICD-10-CM | POA: Diagnosis not present

## 2019-08-16 DIAGNOSIS — I6930 Unspecified sequelae of cerebral infarction: Secondary | ICD-10-CM

## 2019-08-16 DIAGNOSIS — E1165 Type 2 diabetes mellitus with hyperglycemia: Secondary | ICD-10-CM

## 2019-08-16 DIAGNOSIS — Z951 Presence of aortocoronary bypass graft: Secondary | ICD-10-CM | POA: Insufficient documentation

## 2019-08-16 DIAGNOSIS — I25119 Atherosclerotic heart disease of native coronary artery with unspecified angina pectoris: Secondary | ICD-10-CM

## 2019-08-16 HISTORY — DX: Unspecified sequelae of cerebral infarction: I69.30

## 2019-08-16 HISTORY — DX: Heart failure, unspecified: I50.9

## 2019-08-16 HISTORY — DX: Presence of aortocoronary bypass graft: Z95.1

## 2019-08-16 MED ORDER — RANOLAZINE ER 500 MG PO TB12: 500.0000 mg | ORAL_TABLET | Freq: Two times a day (BID) | ORAL | 2 refills | Status: DC

## 2019-08-16 NOTE — Addendum Note (Signed)
Addended by: Ashok Norris on: 08/16/2019 10:53 AM   Modules accepted: Orders

## 2019-08-16 NOTE — Patient Instructions (Signed)
Medication Instructions:  Your physician has recommended you make the following change in your medication:  Start: Ranolazine 500 mg twice daily    *If you need a refill on your cardiac medications before your next appointment, please call your pharmacy*  Lab Work: None.  If you have labs (blood work) drawn today and your tests are completely normal, you will receive your results only by: Marland Kitchen MyChart Message (if you have MyChart) OR . A paper copy in the mail If you have any lab test that is abnormal or we need to change your treatment, we will call you to review the results.  Testing/Procedures: None.   Follow-Up: At [facility], you and your health needs are our priority.  As part of our continuing mission to provide you with exceptional heart care, we have created designated Provider Care Teams.  These Care Teams include your primary Cardiologist (physician) and Advanced Practice Providers (APPs -  Physician Assistants and Nurse Practitioners) who all work together to provide you with the care you need, when you need it.  Your next appointment:   1 month(s)  The format for your next appointment:   In Person  Provider:   Jenne Campus, MD  Other Instructions  Ranolazine tablets, extended release What is this medicine? RANOLAZINE (ra NOE la zeen) is a heart medicine. It is used to treat chronic chest pain (angina). This medicine must be taken regularly. It will not relieve an acute episode of chest pain. This medicine may be used for other purposes; ask your health care provider or pharmacist if you have questions. COMMON BRAND NAME(S): Ranexa What should I tell my health care provider before I take this medicine? They need to know if you have any of these conditions:  heart disease  irregular heartbeat  kidney disease  liver disease  low levels of potassium or magnesium in the blood  an unusual or allergic reaction to ranolazine, other medicines, foods, dyes,  or preservatives  pregnant or trying to get pregnant  breast-feeding How should I use this medicine? Take this medicine by mouth with a glass of water. Follow the directions on the prescription label. Do not cut, crush, or chew this medicine. Take with or without food. Do not take this medication with grapefruit juice. Take your doses at regular intervals. Do not take your medicine more often then directed. Talk to your pediatrician regarding the use of this medicine in children. Special care may be needed. Overdosage: If you think you have taken too much of this medicine contact a poison control center or emergency room at once. NOTE: This medicine is only for you. Do not share this medicine with others. What if I miss a dose? If you miss a dose, take it as soon as you can. If it is almost time for your next dose, take only that dose. Do not take double or extra doses. What may interact with this medicine? Do not take this medicine with any of the following medications:  antivirals for HIV or AIDS  cerivastatin  certain antibiotics like chloramphenicol, clarithromycin, dalfopristin; quinupristin, isoniazid, rifabutin, rifampin, rifapentine  certain medicines used for cancer like imatinib, nilotinib  certain medicines for fungal infections like fluconazole, itraconazole, ketoconazole, posaconazole, voriconazole  certain medicines for irregular heart beat like dronedarone  certain medicines for seizures like carbamazepine, fosphenytoin, oxcarbazepine, phenobarbital, phenytoin  cisapride  conivaptan  cyclosporine  grapefruit or grapefruit juice  lumacaftor; ivacaftor  nefazodone  pimozide  quinacrine  St John's wort  thioridazine This medicine may also interact with the following medications:  alfuzosin  certain medicines for depression, anxiety, or psychotic disturbances like bupropion, citalopram, fluoxetine, fluphenazine, paroxetine, perphenazine, risperidone,  sertraline, trifluoperazine  certain medicines for cholesterol like atorvastatin, lovastatin, simvastatin  certain medicines for stomach problems like octreotide, palonosetron, prochlorperazine  eplerenone  ergot alkaloids like dihydroergotamine, ergonovine, ergotamine, methylergonovine  metformin  nicardipine  other medicines that prolong the QT interval (cause an abnormal heart rhythm) like dofetilide, ziprasidone  sirolimus  tacrolimus This list may not describe all possible interactions. Give your health care provider a list of all the medicines, herbs, non-prescription drugs, or dietary supplements you use. Also tell them if you smoke, drink alcohol, or use illegal drugs. Some items may interact with your medicine. What should I watch for while using this medicine? Visit your doctor for regular check ups. Tell your doctor or healthcare professional if your symptoms do not start to get better or if they get worse. This medicine will not relieve an acute attack of angina or chest pain. This medicine can change your heart rhythm. Your health care provider may check your heart rhythm by ordering an electrocardiogram (ECG) while you are taking this medicine. You may get drowsy or dizzy. Do not drive, use machinery, or do anything that needs mental alertness until you know how this medicine affects you. Do not stand or sit up quickly, especially if you are an older patient. This reduces the risk of dizzy or fainting spells. Alcohol may interfere with the effect of this medicine. Avoid alcoholic drinks. If you are scheduled for any medical or dental procedure, tell your healthcare provider that you are taking this medicine. This medicine can interact with other medicines used during surgery. What side effects may I notice from receiving this medicine? Side effects that you should report to your doctor or health care professional as soon as possible:  allergic reactions like skin rash,  itching or hives, swelling of the face, lips, or tongue  breathing problems  changes in vision  fast, irregular or pounding heartbeat  feeling faint or lightheaded, falls  low or high blood pressure  numbness or tingling feelings  ringing in the ears  tremor or shakiness  slow heartbeat (fewer than 50 beats per minute)  swelling of the legs or feet Side effects that usually do not require medical attention (report to your doctor or health care professional if they continue or are bothersome):  constipation  drowsy  dry mouth  headache  nausea or vomiting  stomach upset This list may not describe all possible side effects. Call your doctor for medical advice about side effects. You may report side effects to FDA at 1-800-FDA-1088. Where should I keep my medicine? Keep out of the reach of children. Store at room temperature between 15 and 30 degrees C (59 and 86 degrees F). Throw away any unused medicine after the expiration date. NOTE: This sheet is a summary. It may not cover all possible information. If you have questions about this medicine, talk to your doctor, pharmacist, or health care provider.  2020 Elsevier/Gold Standard (2018-07-13 09:18:49)

## 2019-08-16 NOTE — Progress Notes (Signed)
Cardiology Office Note:    Date:  08/16/2019   ID:  Patricia Dawson, Patricia Dawson July 27, 1941, MRN [id number]  PCP:  Myrlene Broker, MD  Cardiologist:  Jenne Campus, MD    Referring MD: Myrlene Broker, MD   Chief Complaint  Patient presents with  . Follow-up  Doing better  History of Present Illness:    Patricia Dawson is a 79 y.o. female with coronary artery disease status post coronary bypass grafting 2009 done [facility].  Recently she was admitted around the hospital because of shortness of breath and congestive heart failure aggressive diuresis has been started she did quite well.  Interesting echocardiogram showed preserved left ventricle ejection fraction no clear-cut explanation for her congestive heart failure.  For additional problem includes COPD, hypertension, dyslipidemia.  Also recently she had some back surgery and as a complication of the surgery she ended up having CVA following day.  She comes today to my office for follow-up.  Overall doing well.  Denies have any chest pain, tightness, pressure, burning in the chest minimal swelling of lower extremities at evening time but overall seems to be doing well.  Past Medical History:  Diagnosis Date  . Arthritis    "right knee"  (03/07/2013)  . Coronary artery disease   . GERD (gastroesophageal reflux disease)   . Hyperlipemia   . Hypertension   . OSA (obstructive sleep apnea)    "tried off and on for several years to wear mask; I can't" (03/07/2013)  . PONV (postoperative nausea and vomiting)   . Sinus headache    "often" (03/07/2013)  . Type II diabetes mellitus ([place])     Past Surgical History:  Procedure Laterality Date  . AXILLARY LYMPH NODE DISSECTION Right   . CHOLECYSTECTOMY    . CORONARY ANGIOPLASTY    . CORONARY ARTERY BYPASS GRAFT  2009   "CABG X4" (03/07/2013)  . MEDIAL PARTIAL KNEE REPLACEMENT Right 03/07/2013  . MEDIAL PARTIAL KNEE REPLACEMENT Right 03/07/2013   Procedure: MEDIAL PARTIAL KNEE  REPLACEMENT;  Surgeon: Vickey Huger, MD;  Location: [place];  Service: Orthopedics;  Laterality: Right;  . TONSILLECTOMY AND ADENOIDECTOMY  ~ 1968  . VAGINAL HYSTERECTOMY      Current Medications: Current Meds  Medication Sig  . amLODipine (NORVASC) 10 MG tablet Take 10 mg by mouth daily.  Marland Kitchen aspirin 81 MG tablet Take 81 mg by mouth daily.  Marland Kitchen atorvastatin (LIPITOR) 40 MG tablet Take 40 mg by mouth daily.  Marland Kitchen BIOTIN 5000 PO Take by mouth.  . cholecalciferol (VITAMIN D) 1000 UNITS tablet Take 1,000 Units by mouth daily.  . Cyanocobalamin (VITAMIN B 12 PO) Take by mouth.  . furosemide (LASIX) 40 MG tablet Take 40 mg by mouth daily.  Marland Kitchen lisinopril (ZESTRIL) 40 MG tablet Take 40 mg by mouth daily.  . Melatonin 10 MG TABS Take by mouth.  . nitroGLYCERIN (NITROSTAT) 0.4 MG SL tablet Place 0.4 mg under the tongue.  Marland Kitchen omeprazole (PRILOSEC) 20 MG capsule Take 20 mg by mouth 2 (two) times daily.  Marland Kitchen oxyCODONE (OXY IR/ROXICODONE) 5 MG immediate release tablet Take 1 tablet by mouth as needed.  . traZODone (DESYREL) 100 MG tablet Take 100 mg by mouth at bedtime.  Marland Kitchen VITAMIN D, ERGOCALCIFEROL, PO Take by mouth.     Allergies:   Codeine, Darvocet [propoxyphene n-acetaminophen], Percocet [oxycodone-acetaminophen], and Tramadol   Social History   Socioeconomic History  . Marital status: Married    Spouse name: Not  on file  . Number of children: Not on file  . Years of education: Not on file  . Highest education level: Not on file  Occupational History  . Not on file  Tobacco Use  . Smoking status: Never Smoker  . Smokeless tobacco: Never Used  Substance and Sexual Activity  . Alcohol use: No  . Drug use: No  . Sexual activity: Not Currently  Other Topics Concern  . Not on file  Social History Narrative  . Not on file   Social Determinants of Health   Financial Resource Strain:   . Difficulty of Paying Living Expenses: Not on file  Food Insecurity:   . Worried About Charity fundraiser in  the Last Year: Not on file  . Ran Out of Food in the Last Year: Not on file  Transportation Needs:   . Lack of Transportation (Medical): Not on file  . Lack of Transportation (Non-Medical): Not on file  Physical Activity:   . Days of Exercise per Week: Not on file  . Minutes of Exercise per Session: Not on file  Stress:   . Feeling of Stress : Not on file  Social Connections:   . Frequency of Communication with Friends and Family: Not on file  . Frequency of Social Gatherings with Friends and Family: Not on file  . Attends Religious Services: Not on file  . Active Member of Clubs or Organizations: Not on file  . Attends Archivist Meetings: Not on file  . Marital Status: Not on file     Family History: The patient's family history is not on file. ROS:   Please see the history of present illness.    All 14 point review of systems negative except as described per history of present illness  EKGs/Labs/Other Studies Reviewed:      Recent Labs: No results found for requested labs within last 8760 hours.  Recent Lipid Panel No results found for: CHOL, TRIG, HDL, CHOLHDL, VLDL, LDLCALC, LDLDIRECT  Physical Exam:    VS:  BP (!) 142/80   Pulse 85   Ht 5' 2.5" (1.588 m)   Wt 184 lb (83.5 kg)   SpO2 97%   BMI 33.12 kg/m     Wt Readings from Last 3 Encounters:  08/16/19 184 lb (83.5 kg)  09/23/18 190 lb (86.2 kg)  02/22/13 200 lb 3.2 oz (90.8 kg)     GEN:  Well nourished, well developed in no acute distress HEENT: Normal NECK: No JVD; No carotid bruits LYMPHATICS: No lymphadenopathy CARDIAC: RRR, no murmurs, no rubs, no gallops RESPIRATORY:  Clear to auscultation without rales, wheezing or rhonchi  ABDOMEN: Soft, non-tender, non-distended MUSCULOSKELETAL:  No edema; No deformity  SKIN: Warm and dry LOWER EXTREMITIES: no swelling NEUROLOGIC:  Alert and oriented x 3 PSYCHIATRIC:  Normal affect   ASSESSMENT:    1. Type 2 diabetes mellitus with  hyperglycemia, without long-term current use of insulin ([place])   2. Status post coronary artery bypass graft 2009 at [facility]   3. Chronic diastolic congestive heart failure ([place])   4. Late effect of cerebrovascular accident (CVA)   5. Coronary artery disease involving native coronary artery of native heart with angina pectoris ([place])    PLAN:    In order of problems listed above:  1. Type 2 diabetes followed by internal medicine team.  Stable 2. Coronary artery disease I offer her stress test to try to make sure she does not have  any inducible ischemia but she declined she said she got a stress test done in January last year and she absolutely hated this she does not want to have it.  I will try to add ranolazine 500 mg twice daily to her medical regimen. 3. Chronic diastolic congestive heart failure appears to be compensated right now.  We will continue present management. 4. Late effect of CVA.  Stable from that point review on appropriate medication which includes statin.   Medication Adjustments/Labs and Tests Ordered: Current medicines are reviewed at length with the patient today.  Concerns regarding medicines are outlined above.  No orders of the defined types were placed in this encounter.  Medication changes: No orders of the defined types were placed in this encounter.   Signed, Park Liter, MD, [facility] 08/16/2019 10:46 AM    [place]

## 2019-08-16 NOTE — Addendum Note (Signed)
Addended by: Ashok Norris on: 08/16/2019 11:18 AM   Modules accepted: Orders

## 2019-08-17 DIAGNOSIS — I4891 Unspecified atrial fibrillation: Secondary | ICD-10-CM | POA: Insufficient documentation

## 2019-08-17 DIAGNOSIS — R001 Bradycardia, unspecified: Secondary | ICD-10-CM | POA: Insufficient documentation

## 2019-08-17 HISTORY — DX: Unspecified atrial fibrillation: I48.91

## 2019-08-17 HISTORY — DX: Bradycardia, unspecified: R00.1

## 2019-08-18 DIAGNOSIS — I5032 Chronic diastolic (congestive) heart failure: Secondary | ICD-10-CM

## 2019-08-18 DIAGNOSIS — E669 Obesity, unspecified: Secondary | ICD-10-CM

## 2019-08-18 DIAGNOSIS — I4891 Unspecified atrial fibrillation: Secondary | ICD-10-CM | POA: Diagnosis not present

## 2019-08-18 DIAGNOSIS — E139 Other specified diabetes mellitus without complications: Secondary | ICD-10-CM

## 2019-08-18 DIAGNOSIS — E785 Hyperlipidemia, unspecified: Secondary | ICD-10-CM

## 2019-08-18 DIAGNOSIS — I10 Essential (primary) hypertension: Secondary | ICD-10-CM | POA: Diagnosis not present

## 2019-08-19 DIAGNOSIS — I4891 Unspecified atrial fibrillation: Secondary | ICD-10-CM | POA: Diagnosis not present

## 2019-08-19 DIAGNOSIS — I10 Essential (primary) hypertension: Secondary | ICD-10-CM | POA: Diagnosis not present

## 2019-08-19 DIAGNOSIS — E785 Hyperlipidemia, unspecified: Secondary | ICD-10-CM | POA: Diagnosis not present

## 2019-08-19 DIAGNOSIS — E669 Obesity, unspecified: Secondary | ICD-10-CM | POA: Diagnosis not present

## 2019-08-23 DIAGNOSIS — I5032 Chronic diastolic (congestive) heart failure: Secondary | ICD-10-CM

## 2019-08-23 HISTORY — DX: Chronic diastolic (congestive) heart failure: I50.32

## 2019-09-12 ENCOUNTER — Other Ambulatory Visit: Payer: Self-pay | Admitting: Cardiology

## 2019-09-15 NOTE — Telephone Encounter (Signed)
Patient requesting a refill of Diltiazem. It is not on medication list. Will route to MD to advise if he would like it added or denied.

## 2019-09-16 ENCOUNTER — Ambulatory Visit (INDEPENDENT_AMBULATORY_CARE_PROVIDER_SITE_OTHER): Payer: Medicare Other | Admitting: Cardiology

## 2019-09-16 ENCOUNTER — Encounter: Payer: Self-pay | Admitting: Cardiology

## 2019-09-16 ENCOUNTER — Other Ambulatory Visit: Payer: Self-pay

## 2019-09-16 VITALS — BP 114/72 | HR 92 | Temp 97.3°F | Ht 62.0 in | Wt 177.0 lb

## 2019-09-16 DIAGNOSIS — I25119 Atherosclerotic heart disease of native coronary artery with unspecified angina pectoris: Secondary | ICD-10-CM

## 2019-09-16 DIAGNOSIS — I10 Essential (primary) hypertension: Secondary | ICD-10-CM | POA: Diagnosis not present

## 2019-09-16 DIAGNOSIS — I480 Paroxysmal atrial fibrillation: Secondary | ICD-10-CM

## 2019-09-16 MED ORDER — APIXABAN 5 MG PO TABS: 5.0000 mg | ORAL_TABLET | Freq: Two times a day (BID) | ORAL | 12 refills | Status: DC

## 2019-09-16 NOTE — Addendum Note (Signed)
Addended by: Truddie Hidden on: 09/16/2019 04:40 PM   Modules accepted: Orders

## 2019-09-16 NOTE — Progress Notes (Signed)
Cardiology Office Note:    Date:  09/16/2019   ID:  Patricia Dawson, Patricia Dawson 11/08/1940, MRN [id number]  PCP:  Myrlene Broker, MD  Cardiologist:  Jenne Campus, MD    Referring MD: Myrlene Broker, MD   No chief complaint on file.   History of Present Illness:    Patricia Dawson is a 79 y.o. female with past medical history significant for coronary artery disease, status post coronary artery bypass grafting 2009 done in [facility].  Also history of this lipidemia, essential hypertension, COPD.  In January she ended up having CVA.  Which was complication of back surgery.  But just few weeks ago she showed up in [facility] with palpitations she was found to be in atrial fibrillation.  She was given calcium channel blocker as well as anticoagulation converted spontaneously to sinus rhythm and comes today to my office for follow-up.  Overall she feels better she said she can have more energy she can do more she does have difficulty walking around still complaining of weakness on the left side but overall seems to be improving quite significantly.  Past Medical History:  Diagnosis Date  . Arthritis    "right knee"  (03/07/2013)  . Coronary artery disease   . GERD (gastroesophageal reflux disease)   . Hyperlipemia   . Hypertension   . OSA (obstructive sleep apnea)    "tried off and on for several years to wear mask; I can't" (03/07/2013)  . PONV (postoperative nausea and vomiting)   . Sinus headache    "often" (03/07/2013)  . Type II diabetes mellitus ([place])     Past Surgical History:  Procedure Laterality Date  . AXILLARY LYMPH NODE DISSECTION Right   . CHOLECYSTECTOMY    . CORONARY ANGIOPLASTY    . CORONARY ARTERY BYPASS GRAFT  2009   "CABG X4" (03/07/2013)  . MEDIAL PARTIAL KNEE REPLACEMENT Right 03/07/2013  . MEDIAL PARTIAL KNEE REPLACEMENT Right 03/07/2013   Procedure: MEDIAL PARTIAL KNEE REPLACEMENT;  Surgeon: Vickey Huger, MD;  Location: [place];  Service:  Orthopedics;  Laterality: Right;  . TONSILLECTOMY AND ADENOIDECTOMY  ~ 1968  . VAGINAL HYSTERECTOMY      Current Medications: Current Meds  Medication Sig  . aspirin 81 MG tablet Take 81 mg by mouth daily.  Marland Kitchen atorvastatin (LIPITOR) 40 MG tablet Take 40 mg by mouth daily.  Marland Kitchen BIOTIN 5000 PO Take by mouth.  . cholecalciferol (VITAMIN D) 1000 UNITS tablet Take 1,000 Units by mouth daily.  . Cyanocobalamin (VITAMIN B 12 PO) Take by mouth.  . diltiazem (CARDIZEM CD) 120 MG 24 hr capsule TAKE ONE CAPSULE BY MOUTH DAILY  . furosemide (LASIX) 40 MG tablet Take 40 mg by mouth daily.  Marland Kitchen lisinopril (ZESTRIL) 40 MG tablet Take 40 mg by mouth daily.  . Melatonin 10 MG TABS Take by mouth.  . nitroGLYCERIN (NITROSTAT) 0.4 MG SL tablet Place 0.4 mg under the tongue.  Marland Kitchen omeprazole (PRILOSEC) 20 MG capsule Take 20 mg by mouth 2 (two) times daily.  . ranolazine (RANEXA) 500 MG 12 hr tablet Take 1 tablet (500 mg total) by mouth 2 (two) times daily.  . traZODone (DESYREL) 100 MG tablet Take 100 mg by mouth at bedtime.  Marland Kitchen VITAMIN D, ERGOCALCIFEROL, PO Take by mouth.     Allergies:   Codeine, Darvocet [propoxyphene n-acetaminophen], Percocet [oxycodone-acetaminophen], and Tramadol   Social History   Socioeconomic History  . Marital status: Married    Spouse  name: Not on file  . Number of children: Not on file  . Years of education: Not on file  . Highest education level: Not on file  Occupational History  . Not on file  Tobacco Use  . Smoking status: Never Smoker  . Smokeless tobacco: Never Used  Substance and Sexual Activity  . Alcohol use: No  . Drug use: No  . Sexual activity: Not Currently  Other Topics Concern  . Not on file  Social History Narrative  . Not on file   Social Determinants of Health   Financial Resource Strain:   . Difficulty of Paying Living Expenses: Not on file  Food Insecurity:   . Worried About Charity fundraiser in the Last Year: Not on file  . Ran Out of Food  in the Last Year: Not on file  Transportation Needs:   . Lack of Transportation (Medical): Not on file  . Lack of Transportation (Non-Medical): Not on file  Physical Activity:   . Days of Exercise per Week: Not on file  . Minutes of Exercise per Session: Not on file  Stress:   . Feeling of Stress : Not on file  Social Connections:   . Frequency of Communication with Friends and Family: Not on file  . Frequency of Social Gatherings with Friends and Family: Not on file  . Attends Religious Services: Not on file  . Active Member of Clubs or Organizations: Not on file  . Attends Archivist Meetings: Not on file  . Marital Status: Not on file     Family History: The patient's family history is not on file. ROS:   Please see the history of present illness.    All 14 point review of systems negative except as described per history of present illness  EKGs/Labs/Other Studies Reviewed:      Recent Labs: No results found for requested labs within last 8760 hours.  Recent Lipid Panel No results found for: CHOL, TRIG, HDL, CHOLHDL, VLDL, LDLCALC, LDLDIRECT  Physical Exam:    VS:  BP 114/72   Pulse 92   Temp (!) 97.3 F (36.3 C)   Ht 5\' 2"  (1.575 m)   Wt 177 lb (80.3 kg)   SpO2 98%   BMI 32.37 kg/m     Wt Readings from Last 3 Encounters:  09/16/19 177 lb (80.3 kg)  08/16/19 184 lb (83.5 kg)  09/23/18 190 lb (86.2 kg)     GEN:  Well nourished, well developed in no acute distress HEENT: Normal NECK: No JVD; No carotid bruits LYMPHATICS: No lymphadenopathy CARDIAC: RRR, no murmurs, no rubs, no gallops RESPIRATORY:  Clear to auscultation without rales, wheezing or rhonchi  ABDOMEN: Soft, non-tender, non-distended MUSCULOSKELETAL:  No edema; No deformity  SKIN: Warm and dry LOWER EXTREMITIES: no swelling NEUROLOGIC:  Alert and oriented x 3 PSYCHIATRIC:  Normal affect   ASSESSMENT:    1. PAF (paroxysmal atrial fibrillation) ([place])   2. Essential hypertension     3. Coronary artery disease involving native coronary artery of native heart with angina pectoris ([place])    PLAN:    In order of problems listed above:  1. Paroxysmal atrial fibrillation.  Doing well from that point review.  Today EKG showed normal sinus rhythm low voltage QRS nonspecific ST segment changes.  Continue anticoagulation with Eliquis 5 mg daily I explained to her the reason for it she understood she will continue. 2. Essential hypertension blood pressure today 114/72.  We will continue  present management. 3. Coronary artery disease stable from that point review status post coronary bypass grafting 2009.  We will continue present management. 4. Dyslipidemia we will make arrangements for fasting lipid profile.  I did review record from the hospital for the visit.   Medication Adjustments/Labs and Tests Ordered: Current medicines are reviewed at length with the patient today.  Concerns regarding medicines are outlined above.  No orders of the defined types were placed in this encounter.  Medication changes: No orders of the defined types were placed in this encounter.   Signed, Park Liter, MD, [facility] 09/16/2019 3:38 PM    [place]

## 2019-09-16 NOTE — Patient Instructions (Signed)
Medication Instructions:  Your physician has recommended you make the following change in your medication:   Start Eliquis 5 mg twice daily.  *If you need a refill on your cardiac medications before your next appointment, please call your pharmacy*  Lab Work: None ordered If you have labs (blood work) drawn today and your tests are completely normal, you will receive your results only by: Marland Kitchen MyChart Message (if you have MyChart) OR . A paper copy in the mail If you have any lab test that is abnormal or we need to change your treatment, we will call you to review the results.  Testing/Procedures: None ordered  Follow-Up: At [facility], you and your health needs are our priority.  As part of our continuing mission to provide you with exceptional heart care, we have created designated Provider Care Teams.  These Care Teams include your primary Cardiologist (physician) and Advanced Practice Providers (APPs -  Physician Assistants and Nurse Practitioners) who all work together to provide you with the care you need, when you need it.  Your next appointment:   2 month(s)  The format for your next appointment:   In Person  Provider:   Jenne Campus, MD  Other Instructions NA

## 2019-09-21 ENCOUNTER — Other Ambulatory Visit: Payer: Self-pay | Admitting: *Deleted

## 2019-09-21 MED ORDER — DILTIAZEM HCL ER COATED BEADS 120 MG PO CP24: 120.0000 mg | ORAL_CAPSULE | Freq: Every day | ORAL | 1 refills | Status: DC

## 2019-09-21 MED ORDER — ATORVASTATIN CALCIUM 40 MG PO TABS: 40.0000 mg | ORAL_TABLET | Freq: Every day | ORAL | 1 refills | Status: DC

## 2019-10-04 NOTE — Addendum Note (Signed)
Addended by: Linton Ham on: 10/04/2019 08:45 AM   Modules accepted: Orders

## 2019-10-10 ENCOUNTER — Other Ambulatory Visit: Payer: Self-pay | Admitting: Cardiology

## 2019-11-02 ENCOUNTER — Telehealth: Payer: Self-pay | Admitting: Cardiology

## 2019-11-02 NOTE — Telephone Encounter (Signed)
Aware of this. It was faxed because the patient wanted it mailed so she could send all info together but I wanted to fax this as well so we would be sure they received our portion.

## 2019-11-02 NOTE — Telephone Encounter (Signed)
Seth Bake with [employer] patient assistance called to state that they only received the provider application and not the patient's application.

## 2019-11-17 ENCOUNTER — Other Ambulatory Visit: Payer: Self-pay

## 2019-11-17 DIAGNOSIS — E871 Hypo-osmolality and hyponatremia: Secondary | ICD-10-CM

## 2019-11-17 DIAGNOSIS — E669 Obesity, unspecified: Secondary | ICD-10-CM

## 2019-11-17 DIAGNOSIS — E860 Dehydration: Secondary | ICD-10-CM

## 2019-11-17 DIAGNOSIS — E876 Hypokalemia: Secondary | ICD-10-CM

## 2019-11-17 DIAGNOSIS — I2510 Atherosclerotic heart disease of native coronary artery without angina pectoris: Secondary | ICD-10-CM | POA: Insufficient documentation

## 2019-11-17 DIAGNOSIS — E1165 Type 2 diabetes mellitus with hyperglycemia: Secondary | ICD-10-CM

## 2019-11-17 HISTORY — DX: Dehydration: E86.0

## 2019-11-17 HISTORY — DX: Hypokalemia: E87.6

## 2019-11-17 HISTORY — DX: Obesity, unspecified: E66.9

## 2019-11-17 HISTORY — DX: Type 2 diabetes mellitus with hyperglycemia: E11.65

## 2019-11-17 HISTORY — DX: Hypo-osmolality and hyponatremia: E87.1

## 2019-11-18 ENCOUNTER — Ambulatory Visit (INDEPENDENT_AMBULATORY_CARE_PROVIDER_SITE_OTHER): Payer: Medicare Other | Admitting: Cardiology

## 2019-11-18 ENCOUNTER — Encounter: Payer: Self-pay | Admitting: Cardiology

## 2019-11-18 ENCOUNTER — Other Ambulatory Visit: Payer: Self-pay

## 2019-11-18 VITALS — BP 116/76 | HR 71 | Temp 97.4°F | Ht 61.0 in | Wt 178.6 lb

## 2019-11-18 DIAGNOSIS — Z951 Presence of aortocoronary bypass graft: Secondary | ICD-10-CM | POA: Diagnosis not present

## 2019-11-18 DIAGNOSIS — I6930 Unspecified sequelae of cerebral infarction: Secondary | ICD-10-CM | POA: Diagnosis not present

## 2019-11-18 DIAGNOSIS — I480 Paroxysmal atrial fibrillation: Secondary | ICD-10-CM

## 2019-11-18 NOTE — Patient Instructions (Addendum)
Medication Instructions:  Your physician recommends that you continue on your current medications as directed. Please refer to the Current Medication list given to you today.  *If you need a refill on your cardiac medications before your next appointment, please call your pharmacy*   Lab Work: None today If you have labs (blood work) drawn today and your tests are completely normal, you will receive your results only by: Marland Kitchen MyChart Message (if you have MyChart) OR . A paper copy in the mail If you have any lab test that is abnormal or we need to change your treatment, we will call you to review the results.   Testing/Procedures: None today    Follow-Up: At [facility], you and your health needs are our priority.  As part of our continuing mission to provide you with exceptional heart care, we have created designated Provider Care Teams.  These Care Teams include your primary Cardiologist (physician) and Advanced Practice Providers (APPs -  Physician Assistants and Nurse Practitioners) who all work together to provide you with the care you need, when you need it.  We recommend signing up for the patient portal called "MyChart".  Sign up information is provided on this After Visit Summary.  MyChart is used to connect with patients for Virtual Visits (Telemedicine).  Patients are able to view lab/test results, encounter notes, upcoming appointments, etc.  Non-urgent messages can be sent to your provider as well.   To learn more about what you can do with MyChart, go to [email or web address].    Your next appointment:   6 month(s)  The format for your next appointment:   In Person  Provider:   Jenne Campus, MD   Other Instructions Your next appointment is Thursday October 21 at 12:40 pm

## 2019-11-18 NOTE — Progress Notes (Signed)
Cardiology Office Note:    Date:  11/18/2019   ID:  Shanzay, Armbrecht 08-07-1940, MRN [id number]  PCP:  Myrlene Broker, MD  Cardiologist:  Jenne Campus, MD    Referring MD: Myrlene Broker, MD   No chief complaint on file. Doing fine  History of Present Illness:    Patricia Dawson is a 79 y.o. female  with past medical history significant for coronary artery disease, status post coronary artery bypass grafting 2009 done in [facility].  Also history of this lipidemia, essential hypertension, COPD.  In January she ended up having CVA.  Which was complication of back surgery.  Comes today 2 months of follow-up.  Since then doingwell.  Denies have any chest pain tightness squeezing pressure burning chest.  He is trying to be active, also start better taking care of her health.  She is scheduled to have mammogram soon.  She also trying to have small garden.  Past Medical History:  Diagnosis Date  . Abnormal nuclear stress test 09/23/2018  . Acute ischemic stroke ([place]) 10/04/2018  . Anxiety 01/26/2017  . Arthritis    "right knee"  (03/07/2013)  . Atopic dermatitis 06/03/2018  . Atrial fibrillation by electrocardiography ([place]) 08/17/2019  . Back pain, lumbosacral 01/26/2017  . Bradycardia 08/17/2019  . Breast pain, left 01/26/2017   Formatting of this note might be different from the original. diagnostic mammogram  . Candidiasis of mouth and esophagus ([place]) 01/26/2017   Overview:  magic mouth wash tid  Formatting of this note might be different from the original. magic mouth wash tid  . Carpal tunnel syndrome 01/26/2017   Overview:  declines further eval at this time  Formatting of this note might be different from the original. declines further eval at this time  . Chronic bilateral low back pain without sciatica 01/26/2017   Overview:  Lumbar spine films at [facility] of this note might be different from the original. Lumbar spine films at [facility]  . Chronic diastolic  (congestive) heart failure ([place]) 08/23/2019  . Chronic maxillary sinusitis 01/26/2017  . Chronic pain of left knee 09/08/2017  . 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 Congestive heart failure (CHF) ([place]) 08/16/2019  . Contact dermatitis and other eczema due to other specified agent 01/26/2017  . Coronary artery disease   . Coronary artery disease involving native coronary artery of native heart with angina pectoris ([place]) 07/24/2015  . Cough 01/26/2017  . Displacement of lumbar disc with radiculopathy 10/04/2018  . Dyslipidemia 07/24/2015  . Dysphagia, pharyngoesophageal phase 01/26/2017  . Esophagitis, reflux 01/26/2017  . Essential hypertension 07/24/2015  . Eustachian tube dysfunction, right 01/26/2017   Overview:  Decongestant and valsalva exercises. Follow up if symptoms do not progressively improve or worsen.  Formatting of this note might be different from the original. Decongestant and valsalva exercises. Follow up if symptoms do not progressively improve or worsen.  . Exacerbation of intermittent asthma 07/15/2018  . Fibrocystic breast 01/26/2017  . Gastric polyp 01/26/2017  . GERD (gastroesophageal reflux disease)   . Hair loss 04/27/2019  . Herpes zoster 01/26/2017  . History of colonic polyps 01/26/2017  . History of depression 01/26/2017  . 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 History of shingles 01/26/2017  . Hospital discharge follow-up 07/26/2019  . Hyperglycemia due to type 2 diabetes mellitus ([place]) 11/17/2019  . 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 discussed treatment options,  declined treatment at this time  . Hyperlipemia   . Hypertension   . Hypokalemia 11/17/2019  . Hyponatremia 11/17/2019  . Hypothyroid 01/26/2017  . 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 Insomnia 01/26/2017  . Late effect of  cerebrovascular accident (CVA) 08/16/2019  . 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 Mastodynia, female 01/26/2017   Overview:  diagnostic mammogram  Overview:  diagnostic mammogram at [facility] of this note might be different from the original. diagnostic mammogram at [facility]  . Medicare annual wellness visit, subsequent 04/27/2019  . Nocturnal hypoxemia 01/26/2017   Formatting of this note might be different from the original. arrange O2 at night through [place] Patient  . Nonrheumatic aortic valve insufficiency 07/24/2015   Overview:  Overview:  Mild Overview:  Mild  Formatting of this note might be different from the original. Overview:  Mild  . Obesity (BMI 30-39.9) 11/17/2019  . OSA (obstructive sleep apnea)    "tried off and on for several years to wear mask; I can't" (03/07/2013)  . Overweight 01/26/2017  . Paroxysmal atrial fibrillation ([place]) 07/18/2016  . 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 ice, nsaids,  exercises  . PONV (postoperative nausea and vomiting)   . 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 Preoperative cardiovascular examination 07/24/2015  . Reactive airway disease with wheezing 01/26/2017   Overview:  arrange O2 at night through [place] Patient  . Rhinosinusitis 01/26/2017  . S/P right unicompartmental knee replacement 05/05/2013  . Sciatica, left side 09/24/2018  . Sebaceous cyst 01/26/2017  . Senile osteoporosis 01/26/2017  . Sinus headache    "often" (03/07/2013)  . 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 Spondylolisthesis, lumbar region 09/24/2018  . Status post coronary artery bypass graft 2009 at [facility] 08/16/2019  . 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 Type 2 diabetes mellitus with hyperglycemia, without long-term current use of insulin ([place]) 01/26/2017   Overview:  decrease metformin to 1 daily  Formatting of  this note might be different from the original. decrease metformin to 1 daily  . Type II diabetes mellitus ([place])   . Unspecified osteoarthritis, unspecified site 01/26/2017  . Vitamin D deficiency 01/26/2017  . Wheezing 01/26/2017    Past Surgical History:  Procedure Laterality Date  . AXILLARY LYMPH NODE DISSECTION Right   . CHOLECYSTECTOMY    . CORONARY ANGIOPLASTY    . CORONARY ARTERY BYPASS GRAFT  2009   "CABG X4" (03/07/2013)  . MEDIAL PARTIAL KNEE REPLACEMENT Right 03/07/2013  . MEDIAL PARTIAL KNEE REPLACEMENT Right 03/07/2013   Procedure: MEDIAL PARTIAL KNEE REPLACEMENT;  Surgeon: Vickey Huger, MD;  Location: [facility];  Service: Orthopedics;  Laterality: Right;  . TONSILLECTOMY AND ADENOIDECTOMY  ~ 1968  . VAGINAL HYSTERECTOMY      Current Medications: Current Meds  Medication Sig  . albuterol (PROVENTIL) (2.5 MG/3ML) 0.083% nebulizer solution Take 2.5 mg by nebulization every 6 (six) hours as needed.  Marland Kitchen apixaban (ELIQUIS) 5 MG TABS tablet Take 1 tablet (5 mg total) by mouth 2 (two) times daily.  Marland Kitchen aspirin 81 MG tablet Take 81 mg by mouth daily.  Marland Kitchen atorvastatin (LIPITOR) 40 MG tablet Take 1 tablet (40 mg total) by mouth daily.  Marland Kitchen BIOTIN 5000 PO Take by mouth.  . cholecalciferol (VITAMIN D) 1000 UNITS tablet Take  1,000 Units by mouth daily.  . Cyanocobalamin (VITAMIN B 12 PO) Take by mouth.  . diltiazem (CARDIZEM CD) 120 MG 24 hr capsule Take 1 capsule (120 mg total) by mouth daily.  . fluticasone (FLONASE) 50 MCG/ACT nasal spray Place 1 spray into both nostrils 2 (two) times daily.  . furosemide (LASIX) 40 MG tablet TAKE ONE TABLET BY MOUTH ONCE DAILY AT 8am  . lisinopril (ZESTRIL) 40 MG tablet Take 40 mg by mouth in the morning and at bedtime.  . Melatonin 10 MG TABS Take by mouth.  . nitroGLYCERIN (NITROSTAT) 0.4 MG SL tablet Place 0.4 mg under the tongue.  Marland Kitchen omeprazole (PRILOSEC) 20 MG capsule Take 1 capsule by mouth daily.  . ranolazine (RANEXA) 500 MG 12 hr tablet TAKE ONE TABLET  BY MOUTH TWICE DAILY  . traZODone (DESYREL) 100 MG tablet Take 100 mg by mouth at bedtime.  Marland Kitchen VITAMIN D, ERGOCALCIFEROL, PO Take by mouth.     Allergies:   Codeine, Darvocet [propoxyphene n-acetaminophen], Percocet [oxycodone-acetaminophen], and Tramadol   Social History   Socioeconomic History  . Marital status: Married    Spouse name: Not on file  . Number of children: Not on file  . Years of education: Not on file  . Highest education level: Not on file  Occupational History  . Not on file  Tobacco Use  . Smoking status: Never Smoker  . Smokeless tobacco: Never Used  Substance and Sexual Activity  . Alcohol use: No  . Drug use: No  . Sexual activity: Not Currently  Other Topics Concern  . Not on file  Social History Narrative  . Not on file   Social Determinants of Health   Financial Resource Strain:   . Difficulty of Paying Living Expenses:   Food Insecurity:   . Worried About Charity fundraiser in the Last Year:   . Arboriculturist in the Last Year:   Transportation Needs:   . Film/video editor (Medical):   Marland Kitchen Lack of Transportation (Non-Medical):   Physical Activity:   . Days of Exercise per Week:   . Minutes of Exercise per Session:   Stress:   . Feeling of Stress :   Social Connections:   . Frequency of Communication with Friends and Family:   . Frequency of Social Gatherings with Friends and Family:   . Attends Religious Services:   . Active Member of Clubs or Organizations:   . Attends Archivist Meetings:   Marland Kitchen Marital Status:      Family History: The patient's family history is not on file. ROS:   Please see the history of present illness.    All 14 point review of systems negative except as described per history of present illness  EKGs/Labs/Other Studies Reviewed:      Recent Labs: No results found for requested labs within last 8760 hours.  Recent Lipid Panel No results found for: CHOL, TRIG, HDL, CHOLHDL, VLDL, LDLCALC,  LDLDIRECT  Physical Exam:    VS:  BP 116/76   Pulse 71   Temp (!) 97.4 F (36.3 C)   Ht 5\' 1"  (1.549 m)   Wt 178 lb 9.6 oz (81 kg)   SpO2 94%   BMI 33.75 kg/m     Wt Readings from Last 3 Encounters:  11/18/19 178 lb 9.6 oz (81 kg)  09/16/19 177 lb (80.3 kg)  08/16/19 184 lb (83.5 kg)     GEN:  Well nourished, well developed in  no acute distress HEENT: Normal NECK: No JVD; No carotid bruits LYMPHATICS: No lymphadenopathy CARDIAC: RRR, no murmurs, no rubs, no gallops RESPIRATORY:  Clear to auscultation without rales, wheezing or rhonchi  ABDOMEN: Soft, non-tender, non-distended MUSCULOSKELETAL:  No edema; No deformity  SKIN: Warm and dry LOWER EXTREMITIES: no swelling NEUROLOGIC:  Alert and oriented x 3 PSYCHIATRIC:  Normal affect   ASSESSMENT:    1. Status post coronary artery bypass graft 2009 at  [facility]   2. Late effect of cerebrovascular accident (CVA)   3. Paroxysmal atrial fibrillation ([place])    PLAN:    In order of problems listed above:  1.  Status post coronary artery bypass grafting 2009.  Asymptomatic doing well, asymptomatic, I did review stress test from hospital which was done in January 2020 showing very small questionable area of ischemia.  Since she is asymptomatic we will continue conservative approach 2.  Late effect of CVA.  Follow-up vancomycin as well as neurology.  She is on aspirin as well as Eliquis.  I told her to talk to neurologist if aspirin can be discontinued. 3.  Paroxysmal atrial fibrillation denies have any palpitations.  Maintaining sinus rhythm.  CHA2DS2-VASc score is 4. 5.  Dyslipidemia: I did review K PN however I do not see any recent fasting lipid profile.  Will call primary care physician to get a copy of it.   Medication Adjustments/Labs and Tests Ordered: Current medicines are reviewed at length with the patient today.  Concerns regarding medicines are outlined above.  No orders of the defined types were  placed in this encounter.  Medication changes: No orders of the defined types were placed in this encounter.   Signed, Park Liter, MD, [facility] 11/18/2019 2:18 PM    [place]

## 2019-12-28 ENCOUNTER — Other Ambulatory Visit: Payer: Self-pay

## 2019-12-28 MED ORDER — ATORVASTATIN CALCIUM 40 MG PO TABS: 40.0000 mg | ORAL_TABLET | Freq: Every day | ORAL | 3 refills | Status: DC

## 2020-01-03 ENCOUNTER — Other Ambulatory Visit: Payer: Self-pay | Admitting: Cardiology

## 2020-04-04 ENCOUNTER — Other Ambulatory Visit: Payer: Self-pay | Admitting: Cardiology

## 2020-05-24 ENCOUNTER — Ambulatory Visit: Payer: Medicare Other | Admitting: Cardiology

## 2020-05-31 ENCOUNTER — Other Ambulatory Visit: Payer: Self-pay | Admitting: Cardiology

## 2020-06-04 HISTORY — PX: OTHER SURGICAL HISTORY: SHX169

## 2020-06-06 DIAGNOSIS — I63412 Cerebral infarction due to embolism of left middle cerebral artery: Secondary | ICD-10-CM | POA: Insufficient documentation

## 2020-06-06 HISTORY — DX: Cerebral infarction due to embolism of left middle cerebral artery: I63.412

## 2020-06-21 ENCOUNTER — Telehealth: Payer: Self-pay | Admitting: Emergency Medicine

## 2020-06-21 NOTE — Telephone Encounter (Signed)
Patient returning phone call about [employer] patient assistance

## 2020-06-21 NOTE — Telephone Encounter (Signed)
Left message for patient to return call regarding [employer] patient assistance.

## 2020-06-22 NOTE — Telephone Encounter (Signed)
Called patient informed her that her assistance renewal paperwork is at the [facility] front desk and she can pick up to fill her portion out. She verbally understood. No further questions.

## 2020-07-03 ENCOUNTER — Ambulatory Visit: Payer: Medicare Other | Admitting: Cardiology

## 2020-07-30 ENCOUNTER — Other Ambulatory Visit: Payer: Self-pay | Admitting: Cardiology

## 2020-08-06 DIAGNOSIS — R519 Headache, unspecified: Secondary | ICD-10-CM | POA: Insufficient documentation

## 2020-08-06 DIAGNOSIS — K219 Gastro-esophageal reflux disease without esophagitis: Secondary | ICD-10-CM | POA: Insufficient documentation

## 2020-08-06 DIAGNOSIS — R112 Nausea with vomiting, unspecified: Secondary | ICD-10-CM | POA: Insufficient documentation

## 2020-08-06 DIAGNOSIS — M1990 Unspecified osteoarthritis, unspecified site: Secondary | ICD-10-CM | POA: Insufficient documentation

## 2020-08-06 DIAGNOSIS — I10 Essential (primary) hypertension: Secondary | ICD-10-CM | POA: Insufficient documentation

## 2020-08-06 DIAGNOSIS — E119 Type 2 diabetes mellitus without complications: Secondary | ICD-10-CM | POA: Insufficient documentation

## 2020-08-06 DIAGNOSIS — Z9889 Other specified postprocedural states: Secondary | ICD-10-CM | POA: Insufficient documentation

## 2020-08-06 DIAGNOSIS — E785 Hyperlipidemia, unspecified: Secondary | ICD-10-CM | POA: Insufficient documentation

## 2020-08-08 ENCOUNTER — Other Ambulatory Visit: Payer: Self-pay

## 2020-08-08 ENCOUNTER — Telehealth (INDEPENDENT_AMBULATORY_CARE_PROVIDER_SITE_OTHER): Payer: Medicare Other | Admitting: Cardiology

## 2020-08-08 ENCOUNTER — Encounter: Payer: Self-pay | Admitting: Cardiology

## 2020-08-08 VITALS — BP 136/70 | HR 64 | Ht 62.0 in | Wt 185.0 lb

## 2020-08-08 DIAGNOSIS — I25119 Atherosclerotic heart disease of native coronary artery with unspecified angina pectoris: Secondary | ICD-10-CM

## 2020-08-08 DIAGNOSIS — I6930 Unspecified sequelae of cerebral infarction: Secondary | ICD-10-CM

## 2020-08-08 DIAGNOSIS — I480 Paroxysmal atrial fibrillation: Secondary | ICD-10-CM

## 2020-08-08 DIAGNOSIS — E785 Hyperlipidemia, unspecified: Secondary | ICD-10-CM

## 2020-08-08 DIAGNOSIS — E1165 Type 2 diabetes mellitus with hyperglycemia: Secondary | ICD-10-CM

## 2020-08-08 NOTE — Progress Notes (Signed)
Virtual Visit via Telephone Note   This visit type was conducted due to national recommendations for restrictions regarding the COVID-19 Pandemic (e.g. social distancing) in an effort to limit this patient's exposure and mitigate transmission in our community.  Due to her co-morbid illnesses, this patient is at least at moderate risk for complications without adequate follow up.  This format is felt to be most appropriate for this patient at this time.  The patient did not have access to video technology/had technical difficulties with video requiring transitioning to audio format only (telephone).  All issues noted in this document were discussed and addressed.  No physical exam could be performed with this format.  Please refer to the patient's chart for her  consent to telehealth for [facility].  Evaluation Performed:  Follow-up visit  This visit type was conducted due to national recommendations for restrictions regarding the COVID-19 Pandemic (e.g. social distancing).  This format is felt to be most appropriate for this patient at this time.  All issues noted in this document were discussed and addressed.  No physical exam was performed (except for noted visual exam findings with Video Visits).  Please refer to the patient's chart (MyChart message for video visits and phone note for telephone visits) for the patient's consent to telehealth for [facility].  Date:  08/08/2020  ID: Hager, Stayner 19-Aug-1940, MRN [id number]   Patient Location: [place] [address]   Provider location:   [place] Office  PCP:  Myrlene Broker, MD  Cardiologist:  Jenne Campus, MD     Chief Complaint: I am doing fine  History of Present Illness:    Patricia Dawson is a 80 y.o. female  who presents via audio/video conferencing for a telehealth visit today.  With past medical history significant for coronary artery disease, status post quadruple bypass graft done in  2009 at [facility] hospital, history of dyslipidemia with intolerance to atorvastatin, essential hypertension, COPD, in January of last year he ended up having CVA, likely recovered quite nicely after that.  She does have a televisit with me today since I was exposed to COVID-19.  Overall she is doing well.  She denies have any chest pain tightness squeezing pressure burning chest.  She did have difficulty tolerating Lipitor and she stopped it and this is the topic of discussion.   The patient does not have symptoms concerning for COVID-19 infection (fever, chills, cough, or new SHORTNESS OF BREATH).    Prior CV studies:   The following studies were reviewed today:       Past Medical History:  Diagnosis Date  . Abnormal nuclear stress test 09/23/2018  . Acute ischemic stroke ([place]) 10/04/2018  . Anxiety 01/26/2017  . Arthritis    "right knee"  (03/07/2013)  . Atopic dermatitis 06/03/2018  . Atrial fibrillation by electrocardiography ([place]) 08/17/2019  . Back pain, lumbosacral 01/26/2017  . Bradycardia 08/17/2019  . Breast pain, left 01/26/2017   Formatting of this note might be different from the original. diagnostic mammogram  . Candidiasis of mouth and esophagus ([place]) 01/26/2017   Overview:  magic mouth wash tid  Formatting of this note might be different from the original. magic mouth wash tid  . Carpal tunnel syndrome 01/26/2017   Overview:  declines further eval at this time  Formatting of this note might be different from the original. declines further eval at this time  . Carpal tunnel syndrome of right  wrist 01/26/2017   Overview:  declines further eval at this time  Formatting of this note might be different from the original. declines further eval at this time  Formatting of this note might be different from the original. declines further eval at this time  . Chronic bilateral low back pain without sciatica 01/26/2017   Overview:  Lumbar spine films at [facility] of this note  might be different from the original. Lumbar spine films at [facility]  . Chronic diastolic (congestive) heart failure (HCC) 08/23/2019  . Chronic maxillary sinusitis 01/26/2017  . Chronic pain of left knee 09/08/2017  . 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 Congestive heart failure (CHF) (HCC) 08/16/2019  . Contact dermatitis and other eczema due to other specified agent 01/26/2017  . Coronary artery disease   . Coronary artery disease involving native coronary artery of native heart with angina pectoris (HCC) 07/24/2015  . Cough 01/26/2017  . Displacement of lumbar disc with radiculopathy 10/04/2018  . Dyslipidemia 07/24/2015  . Dysphagia, pharyngoesophageal phase 01/26/2017  . Esophagitis, reflux 01/26/2017  . Essential hypertension 07/24/2015  . Eustachian tube dysfunction, right 01/26/2017   Overview:  Decongestant and valsalva exercises. Follow up if symptoms do not progressively improve or worsen.  Formatting of this note might be different from the original. Decongestant and valsalva exercises. Follow up if symptoms do not progressively improve or worsen.  . Exacerbation of intermittent asthma 07/15/2018  . Fibrocystic breast 01/26/2017  . Gastric polyp 01/26/2017  . GERD (gastroesophageal reflux disease)   . GERD (gastroesophageal reflux disease)   . Hair loss 04/27/2019  . Herpes zoster 01/26/2017  . History of colonic polyps 01/26/2017  . History of depression 01/26/2017  . 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 History of shingles 01/26/2017  . Hospital discharge follow-up 07/26/2019  . Hyperglycemia due to type 2 diabetes mellitus (HCC) 11/17/2019  . 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 discussed treatment options,  declined treatment at this time  . Hyperlipemia   . Hypertension   . Hypokalemia 11/17/2019  . Hyponatremia  11/17/2019  . Hypothyroid 01/26/2017  . 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 Insomnia 01/26/2017  . Late effect of cerebrovascular accident (CVA) 08/16/2019  . 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 Mastodynia, female 01/26/2017   Overview:  diagnostic mammogram  Overview:  diagnostic mammogram at [facility] of this note might be different from the original. diagnostic mammogram at [facility]  . Medicare annual wellness visit, subsequent 04/27/2019  . Mild intermittent asthma 07/15/2018  . Nocturnal hypoxemia 01/26/2017   Formatting of this note might be different from the original. arrange O2 at night through [facility]  . Nonrheumatic aortic valve insufficiency 07/24/2015   Overview:  Overview:  Mild Overview:  Mild  Formatting of this note might be different from the original. Overview:  Mild  . Obesity (BMI 30-39.9) 11/17/2019  . OSA (obstructive sleep apnea)    "tried off and on for several years to wear mask; I can't" (03/07/2013)  . Overweight 01/26/2017  . Paroxysmal atrial fibrillation (HCC) 07/18/2016  . 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 ice, nsaids,  exercises  . PONV (postoperative nausea and vomiting)   . 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 Preoperative cardiovascular examination  07/24/2015  . Reactive airway disease with wheezing 01/26/2017   Overview:  arrange O2 at night through [facility]  . Rhinosinusitis 01/26/2017  . S/P right unicompartmental knee replacement 05/05/2013  . Sciatica, left side 09/24/2018  . Sebaceous cyst 01/26/2017  . Senile osteoporosis 01/26/2017  . Sinus headache    "often" (03/07/2013)  . 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 Spondylolisthesis, lumbar region 09/24/2018  . Status post coronary artery bypass graft 2009 at [facility] hospital 08/16/2019  . Tietze syndrome  01/26/2017  . Type 2 diabetes mellitus with hyperglycemia, without long-term current use of insulin ([place]) 01/26/2017   Overview:  decrease metformin to 1 daily  Formatting of this note might be different from the original. decrease metformin to 1 daily  . Type II diabetes mellitus ([place])   . Unspecified osteoarthritis, unspecified site 01/26/2017  . Vitamin D deficiency 01/26/2017  . Wheezing 01/26/2017    Past Surgical History:  Procedure Laterality Date  . AXILLARY LYMPH NODE DISSECTION Right   . Cataract  surgery Right 06/2020   Will have L eye done on 08/14/2020  . CHOLECYSTECTOMY    . CORONARY ANGIOPLASTY    . CORONARY ARTERY BYPASS GRAFT  2009   "CABG X4" (03/07/2013)  . MEDIAL PARTIAL KNEE REPLACEMENT Right 03/07/2013  . MEDIAL PARTIAL KNEE REPLACEMENT Right 03/07/2013   Procedure: MEDIAL PARTIAL KNEE REPLACEMENT;  Surgeon: Vickey Huger, MD;  Location: [place];  Service: Orthopedics;  Laterality: Right;  . TONSILLECTOMY AND ADENOIDECTOMY  ~ 1968  . VAGINAL HYSTERECTOMY       Current Meds  Medication Sig  . albuterol (PROVENTIL) (2.5 MG/3ML) 0.083% nebulizer solution Take 2.5 mg by nebulization every 6 (six) hours as needed.  Marland Kitchen apixaban (ELIQUIS) 5 MG TABS tablet Take 1 tablet (5 mg total) by mouth 2 (two) times daily.  Marland Kitchen aspirin 81 MG tablet Take 81 mg by mouth daily.  Marland Kitchen BIOTIN 5000 PO Take by mouth.  . cholecalciferol (VITAMIN D) 1000 UNITS tablet Take 1,000 Units by mouth daily.  . Cyanocobalamin (VITAMIN B 12 PO) Take by mouth.  . diltiazem (CARDIZEM CD) 120 MG 24 hr capsule Take 1 capsule (120 mg total) by mouth daily.  . fluticasone (FLONASE) 50 MCG/ACT nasal spray Place 1 spray into both nostrils 2 (two) times daily.  . furosemide (LASIX) 40 MG tablet TAKE ONE TABLET BY MOUTH ONCE DAILY AT 8am  . lisinopril (ZESTRIL) 40 MG tablet Take 40 mg by mouth in the morning and at bedtime.  . Melatonin 10 MG TABS Take by mouth.  . nitroGLYCERIN (NITROSTAT) 0.4 MG SL tablet Place 0.4 mg  under the tongue.  Marland Kitchen omeprazole (PRILOSEC) 20 MG capsule Take 1 capsule by mouth daily.  . ranolazine (RANEXA) 500 MG 12 hr tablet TAKE ONE TABLET BY MOUTH TWICE DAILY  . traZODone (DESYREL) 100 MG tablet Take 100 mg by mouth at bedtime.  Marland Kitchen VITAMIN D, ERGOCALCIFEROL, PO Take by mouth.      Family History: The patient's family history is not on file.   ROS:   Please see the history of present illness.     All other systems reviewed and are negative.   Labs/Other Tests and Data Reviewed:     Recent Labs: No results found for requested labs within last 8760 hours.  Recent Lipid Panel No results found for: CHOL, TRIG, HDL, CHOLHDL, VLDL, LDLCALC, LDLDIRECT    Exam:    Vital Signs:  BP 136/70  Pulse 64   Ht 5\' 2"  (1.575 m)   Wt 185 lb (83.9 kg)   BMI 33.84 kg/m     Wt Readings from Last 3 Encounters:  08/08/20 185 lb (83.9 kg)  11/18/19 178 lb 9.6 oz (81 kg)  09/16/19 177 lb (80.3 kg)     Well nourished, well developed in no acute distress. Alert awake and x3 quite happy to be able to talk to me over the phone without any distress  Diagnosis for this visit:   1. Paroxysmal atrial fibrillation (HCC)   2. Coronary artery disease involving native coronary artery of native heart with angina pectoris ([place])   3. Type 2 diabetes mellitus with hyperglycemia, without long-term current use of insulin ([place])   4. Dyslipidemia   5. History of ischemic cerebrovascular accident (CVA) with residual deficit      ASSESSMENT & PLAN:    1.  Paroxysmal atrial fibrillation denies having a palpitations lately, she is on Eliquis which I will continue. 2.  Coronary artery disease stable from that point review, asymptomatic, will continue present management. 3.  Type 2 diabetes followed by the medicine team, I did review last hemoglobin A1c which was 6.6. 4.  Dyslipidemia she was given atorvastatin, however, was not able to tolerate it.  Will check fasting lipid profile and then decide  about therapy.  Clearly her lipids are not controlled. 5.  History of CVA stable from that point.  No new problems.  COVID-19 Education: The signs and symptoms of COVID-19 were discussed with the patient and how to seek care for testing (follow up with PCP or arrange E-visit).  The importance of social distancing was discussed today.  Patient Risk:   After full review of this patients clinical status, I feel that they are at least moderate risk at this time.  Time:   Today, I have spent 15 minutes with the patient with telehealth technology discussing pt health issues.  I spent 15 minutes reviewing her chart before the visit.  Visit was finished at 3:56 PM.    Medication Adjustments/Labs and Tests Ordered: Current medicines are reviewed at length with the patient today.  Concerns regarding medicines are outlined above.  No orders of the defined types were placed in this encounter.  Medication changes: No orders of the defined types were placed in this encounter.    Disposition: Follow-up 5 months  Signed, Park Liter, MD, [facility] 08/08/2020 3:54 PM    [place]

## 2020-09-03 ENCOUNTER — Telehealth: Payer: Self-pay

## 2020-09-03 NOTE — Telephone Encounter (Signed)
Patient Assistance Form for Eliquis completed and faxed.

## 2020-09-03 NOTE — Addendum Note (Signed)
Addended by: Jerl Santos R on: 09/03/2020 01:08 PM   Modules accepted: Orders

## 2020-09-04 ENCOUNTER — Telehealth: Payer: Self-pay | Admitting: Emergency Medicine

## 2020-09-04 DIAGNOSIS — E785 Hyperlipidemia, unspecified: Secondary | ICD-10-CM

## 2020-09-04 LAB — LIPID PANEL
Chol/HDL Ratio: 3.9 ratio (ref 0.0–4.4)
Cholesterol, Total: 213 mg/dL — ABNORMAL HIGH (ref 100–199)
HDL: 55 mg/dL (ref >39)
LDL Chol Calc (NIH): 140 mg/dL — ABNORMAL HIGH (ref 0–99)
Triglycerides: 102 mg/dL (ref 0–149)
VLDL Cholesterol Cal: 18 mg/dL (ref 5–40)

## 2020-09-04 MED ORDER — NEXLETOL 180 MG PO TABS: 180.0000 mg | ORAL_TABLET | Freq: Every day | ORAL | 1 refills | Status: DC

## 2020-09-04 NOTE — Telephone Encounter (Signed)
-----   Message from Park Liter, MD sent at 09/04/2020 12:53 PM EST ----- Cholesterol still not well controlled.  Please give her samples of Nexletol 180 mg daily and then repeat fasting lipid profile within the next 6 weeks

## 2020-09-04 NOTE — Telephone Encounter (Signed)
Called patient. Informed her of results and recommendations. She will start nexletol 180 mg daily and have labs repeated in 6 weeks.

## 2020-09-05 ENCOUNTER — Telehealth: Payer: Self-pay

## 2020-09-05 NOTE — Telephone Encounter (Signed)
Incoming letter form [employer] states, application received and under review. JASN#[id number]

## 2020-09-10 ENCOUNTER — Telehealth: Payer: Self-pay | Admitting: Cardiology

## 2020-09-10 MED ORDER — NEXLETOL 180 MG PO TABS: 180.0000 mg | ORAL_TABLET | Freq: Every day | ORAL | 1 refills | Status: DC

## 2020-09-10 NOTE — Telephone Encounter (Signed)
Called patient assistance company. They informed me that the NPI and License number was not on there gave them this information. The will process application now. No further questions.

## 2020-09-10 NOTE — Telephone Encounter (Signed)
Patient got a call from [facility] about her application. She could not understand what the [employer] said so she is not sure what else needs to be done. She asks that we call about her application. Patient would like [place] to follow up with her after we call [facility]  [phone]

## 2020-09-10 NOTE — Telephone Encounter (Signed)
Patient called and said that there is no medication at her pharmacy for her to pick up.  Patient would like medication to go to [place], [place]

## 2020-09-10 NOTE — Telephone Encounter (Signed)
Patient aware.

## 2020-09-10 NOTE — Addendum Note (Signed)
Addended by: Senaida Ores on: 09/10/2020 01:28 PM   Modules accepted: Orders

## 2020-09-10 NOTE — Telephone Encounter (Signed)
Spoke to patient. Sent rx to correct pharmacy.

## 2020-09-11 ENCOUNTER — Telehealth: Payer: Self-pay

## 2020-09-11 NOTE — Telephone Encounter (Signed)
Per patient assistance foundation application was not approved for the following reason: Documentation of 3% OOP prescription expenses, based on household adjusted gross income, not met. Letter was sent to the patient.

## 2020-09-28 ENCOUNTER — Other Ambulatory Visit: Payer: Self-pay | Admitting: Cardiology

## 2020-09-28 NOTE — Telephone Encounter (Signed)
Refill sent to pharmacy.   

## 2020-10-25 ENCOUNTER — Other Ambulatory Visit: Payer: Self-pay | Admitting: Cardiology

## 2020-12-21 ENCOUNTER — Other Ambulatory Visit: Payer: Self-pay | Admitting: Cardiology

## 2021-01-02 ENCOUNTER — Other Ambulatory Visit: Payer: Self-pay

## 2021-01-08 ENCOUNTER — Ambulatory Visit: Payer: Medicare Other | Admitting: Cardiology

## 2021-01-08 ENCOUNTER — Other Ambulatory Visit: Payer: Self-pay

## 2021-01-08 VITALS — BP 108/54 | HR 74 | Ht 62.0 in | Wt 183.0 lb

## 2021-01-08 DIAGNOSIS — I6930 Unspecified sequelae of cerebral infarction: Secondary | ICD-10-CM | POA: Diagnosis not present

## 2021-01-08 DIAGNOSIS — E782 Mixed hyperlipidemia: Secondary | ICD-10-CM

## 2021-01-08 DIAGNOSIS — I25119 Atherosclerotic heart disease of native coronary artery with unspecified angina pectoris: Secondary | ICD-10-CM

## 2021-01-08 DIAGNOSIS — I10 Essential (primary) hypertension: Secondary | ICD-10-CM

## 2021-01-08 DIAGNOSIS — I480 Paroxysmal atrial fibrillation: Secondary | ICD-10-CM

## 2021-01-08 MED ORDER — EZETIMIBE 10 MG PO TABS: 10.0000 mg | ORAL_TABLET | Freq: Every day | ORAL | 3 refills | Status: DC

## 2021-01-08 NOTE — Progress Notes (Signed)
Cardiology Office Note:    Date:  01/08/2021   ID:  Patricia, Dawson 12-05-1940, MRN [id number]  PCP:  Myrlene Broker, MD  Cardiologist:  Jenne Campus, MD    Referring MD: Myrlene Broker, MD   Chief Complaint  Patient presents with  . Follow-up    Want to dicuss d/c ASA 81    History of Present Illness:    Patricia Dawson is a 80 y.o. female with past medical history significant for paroxysmal atrial fibrillation, status post CVA, anticoagulated with Eliquis, coronary artery disease, status post coronary artery bypass graft done in [facility] in 2009, dyslipidemia with intolerance to statin.  She comes today to also follow-up overall she seems to be doing well denies have any chest pain tightness squeezing pressure burning chest.  She is complaining however of having bruises on her arm she is taking Eliquis as well as aspirin.  I investigated more about her CVA episodes which happened about 2 years ago apparently she was off Eliquis at that time.  Therefore I think we can just simply keep her on Eliquis and we can eliminate aspirin.  She denies having any palpitations, there is no swelling of lower extremities there is no tightness pressure overall seems to be doing well from cardiac standpoint reviewed.  Past Medical History:  Diagnosis Date  . Abnormal nuclear stress test 09/23/2018  . Acute ischemic stroke ([place]) 10/04/2018  . Anxiety 01/26/2017  . Arthritis    "right knee"  (03/07/2013)  . Atopic dermatitis 06/03/2018  . Atrial fibrillation by electrocardiography ([place]) 08/17/2019  . Back pain, lumbosacral 01/26/2017  . Bradycardia 08/17/2019  . Breast pain, left 01/26/2017   Formatting of this note might be different from the original. diagnostic mammogram  . Candidiasis of mouth and esophagus ([place]) 01/26/2017   Overview:  magic mouth wash tid  Formatting of this note might be different from the original. magic mouth wash tid  . Carpal tunnel syndrome 01/26/2017    Overview:  declines further eval at this time  Formatting of this note might be different from the original. declines further eval at this time  . Carpal tunnel syndrome of right wrist 01/26/2017   Overview:  declines further eval at this time  Formatting of this note might be different from the original. declines further eval at this time  Formatting of this note might be different from the original. declines further eval at this time  . Chronic bilateral low back pain without sciatica 01/26/2017   Overview:  Lumbar spine films at [facility] of this note might be different from the original. Lumbar spine films at [facility]  . Chronic diastolic (congestive) heart failure ([place]) 08/23/2019  . Chronic maxillary sinusitis 01/26/2017  . Chronic pain of left knee 09/08/2017  . 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 Congestive heart failure (CHF) ([place]) 08/16/2019  . Contact dermatitis and other eczema due to other specified agent 01/26/2017  . Coronary artery disease   . Coronary artery disease involving native coronary artery of native heart with angina pectoris ([place]) 07/24/2015  . Cough 01/26/2017  . Displacement of lumbar disc with radiculopathy 10/04/2018  . Dyslipidemia 07/24/2015  . Dysphagia, pharyngoesophageal phase 01/26/2017  . Esophagitis, reflux 01/26/2017  . Essential hypertension 07/24/2015  . Eustachian tube dysfunction, right 01/26/2017   Overview:  Decongestant and valsalva exercises. Follow up if symptoms do not progressively improve or worsen.  Formatting of this note might be different from the original.  Decongestant and valsalva exercises. Follow up if symptoms do not progressively improve or worsen.  . Exacerbation of intermittent asthma 07/15/2018  . Fibrocystic breast 01/26/2017  . Gastric polyp 01/26/2017  . GERD (gastroesophageal reflux disease)   . GERD (gastroesophageal reflux disease)   . Hair loss 04/27/2019  . Herpes zoster 01/26/2017  . History of colonic polyps 01/26/2017  . History  of depression 01/26/2017  . 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 History of shingles 01/26/2017  . Hospital discharge follow-up 07/26/2019  . Hyperglycemia due to type 2 diabetes mellitus ([place]) 11/17/2019  . 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 discussed treatment options,  declined treatment at this time  . Hyperlipemia   . Hypertension   . Hypokalemia 11/17/2019  . Hyponatremia 11/17/2019  . Hypothyroid 01/26/2017  . 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 Insomnia 01/26/2017  . Late effect of cerebrovascular accident (CVA) 08/16/2019  . 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 Mastodynia, female 01/26/2017   Overview:  diagnostic mammogram  Overview:  diagnostic mammogram at [facility] of this note might be different from the original. diagnostic mammogram at [facility]  . Medicare annual wellness visit, subsequent 04/27/2019  . Mild intermittent asthma 07/15/2018  . Nocturnal hypoxemia 01/26/2017   Formatting of this note might be different from the original. arrange O2 at night through [place] Patient  . Nonrheumatic aortic valve insufficiency 07/24/2015   Overview:  Overview:  Mild Overview:  Mild  Formatting of this note might be different from the original. Overview:  Mild  . Obesity (BMI 30-39.9) 11/17/2019  . OSA (obstructive sleep apnea)    "tried off and on for several years to wear mask; I can't" (03/07/2013)  . Overweight 01/26/2017  . Paroxysmal atrial fibrillation ([place]) 07/18/2016  . 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 ice, nsaids,  exercises  . PONV (postoperative nausea and vomiting)   . 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 Preoperative cardiovascular examination 07/24/2015  . Reactive airway  disease with wheezing 01/26/2017   Overview:  arrange O2 at night through [place] Patient  . Rhinosinusitis 01/26/2017  . S/P right unicompartmental knee replacement 05/05/2013  . Sciatica, left side 09/24/2018  . Sebaceous cyst 01/26/2017  . Senile osteoporosis 01/26/2017  . Sinus headache    "often" (03/07/2013)  . 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 Spondylolisthesis, lumbar region 09/24/2018  . Status post coronary artery bypass graft 2009 at [facility] 08/16/2019  . 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 Type 2 diabetes mellitus with hyperglycemia, without long-term current use of insulin ([place]) 01/26/2017   Overview:  decrease metformin to 1 daily  Formatting of this note might be different from the original. decrease metformin to 1 daily  . Type II diabetes mellitus ([place])   . Unspecified osteoarthritis, unspecified site 01/26/2017  . Vitamin D deficiency 01/26/2017  . Wheezing 01/26/2017    Past Surgical History:  Procedure Laterality Date  . AXILLARY LYMPH NODE DISSECTION Right   . Cataract  surgery Right 06/2020   Will have L eye done on 08/14/2020  . CHOLECYSTECTOMY    . CORONARY ANGIOPLASTY    . CORONARY ARTERY BYPASS GRAFT  2009   "CABG X4" (03/07/2013)  . MEDIAL PARTIAL  KNEE REPLACEMENT Right 03/07/2013  . MEDIAL PARTIAL KNEE REPLACEMENT Right 03/07/2013   Procedure: MEDIAL PARTIAL KNEE REPLACEMENT;  Surgeon: Vickey Huger, MD;  Location: [place];  Service: Orthopedics;  Laterality: Right;  . TONSILLECTOMY AND ADENOIDECTOMY  ~ 1968  . VAGINAL HYSTERECTOMY      Current Medications: Current Meds  Medication Sig  . albuterol (PROVENTIL) (2.5 MG/3ML) 0.083% nebulizer solution Take 2.5 mg by nebulization every 6 (six) hours as needed for wheezing or shortness of breath.  Marland Kitchen apixaban (ELIQUIS) 5 MG TABS tablet Take 1 tablet (5 mg total) by mouth 2 (two) times daily.  Marland Kitchen aspirin 81 MG tablet Take 81 mg by mouth daily.  Marland Kitchen BIOTIN 5000 PO Take 1  tablet by mouth daily.  . cholecalciferol (VITAMIN D) 1000 UNITS tablet Take 1,000 Units by mouth daily.  . Cyanocobalamin (VITAMIN B 12 PO) Take 1 tablet by mouth daily.  Marland Kitchen diltiazem (CARDIZEM CD) 120 MG 24 hr capsule TAKE ONE CAPSULE BY MOUTH DAILY (Patient taking differently: Take 120 mg by mouth daily.)  . fluticasone (FLONASE) 50 MCG/ACT nasal spray Place 1 spray into both nostrils 2 (two) times daily.  . furosemide (LASIX) 40 MG tablet TAKE ONE TABLET BY MOUTH ONCE DAILY AT 8am (Patient taking differently: Take 40 mg by mouth daily.)  . lisinopril (ZESTRIL) 40 MG tablet Take 40 mg by mouth in the morning and at bedtime.  . Melatonin 10 MG TABS Take 1 tablet by mouth at bedtime.  . nitroGLYCERIN (NITROSTAT) 0.4 MG SL tablet Place 0.4 mg under the tongue every 5 (five) minutes as needed for chest pain.  Marland Kitchen omeprazole (PRILOSEC) 20 MG capsule Take 1 capsule by mouth daily.  . ranolazine (RANEXA) 500 MG 12 hr tablet TAKE ONE TABLET BY MOUTH TWICE DAILY (Patient taking differently: Take 500 mg by mouth 2 (two) times daily.)  . traZODone (DESYREL) 100 MG tablet Take 100 mg by mouth at bedtime.  Marland Kitchen VITAMIN D, ERGOCALCIFEROL, PO Take 1 tablet by mouth daily.     Allergies:   Codeine, Darvocet [propoxyphene n-acetaminophen], Percocet [oxycodone-acetaminophen], and Tramadol   Social History   Socioeconomic History  . Marital status: Married    Spouse name: Not on file  . Number of children: Not on file  . Years of education: Not on file  . Highest education level: Not on file  Occupational History  . Not on file  Tobacco Use  . Smoking status: Never Smoker  . Smokeless tobacco: Never Used  Substance and Sexual Activity  . Alcohol use: No  . Drug use: No  . Sexual activity: Not Currently  Other Topics Concern  . Not on file  Social History Narrative  . Not on file   Social Determinants of Health   Financial Resource Strain: Not on file  Food Insecurity: Not on file  Transportation  Needs: Not on file  Physical Activity: Not on file  Stress: Not on file  Social Connections: Not on file     Family History: The patient's family history is not on file. ROS:   Please see the history of present illness.    All 14 point review of systems negative except as described per history of present illness  EKGs/Labs/Other Studies Reviewed:      Recent Labs: No results found for requested labs within last 8760 hours.  Recent Lipid Panel    Component Value Date/Time   CHOL 213 (H) 09/03/2020 1309   TRIG 102 09/03/2020 1309   HDL 55  09/03/2020 1309   CHOLHDL 3.9 09/03/2020 1309   LDLCALC 140 (H) 09/03/2020 1309    Physical Exam:    VS:  BP (!) 108/54 (BP Location: Right Arm, Patient Position: Sitting)   Pulse 74   Ht 5\' 2"  (1.575 m)   Wt 183 lb (83 kg)   SpO2 90%   BMI 33.47 kg/m     Wt Readings from Last 3 Encounters:  01/08/21 183 lb (83 kg)  08/08/20 185 lb (83.9 kg)  11/18/19 178 lb 9.6 oz (81 kg)     GEN:  Well nourished, well developed in no acute distress HEENT: Normal NECK: No JVD; No carotid bruits LYMPHATICS: No lymphadenopathy CARDIAC: RRR, no murmurs, no rubs, no gallops RESPIRATORY:  Clear to auscultation without rales, wheezing or rhonchi  ABDOMEN: Soft, non-tender, non-distended MUSCULOSKELETAL:  No edema; No deformity  SKIN: Warm and dry LOWER EXTREMITIES: no swelling NEUROLOGIC:  Alert and oriented x 3 PSYCHIATRIC:  Normal affect   ASSESSMENT:    1. Paroxysmal atrial fibrillation (HCC)   2. Essential hypertension   3. Coronary artery disease involving native coronary artery of native heart with angina pectoris ([place])   4. History of ischemic cerebrovascular accident (CVA) with residual deficit   5. Mixed hyperlipidemia    PLAN:    In order of problems listed above:  1. Paroxysmal atrial fibrillation EKG today showed normal sinus rhythm we will continue present management which include anticoagulation. 2. Essential hypertension  blood pressure is well controlled we will continue present medications. 3. Dyslipidemia I did review her K PN from January which showed LDL 140 HDL 50, it is unacceptably high cholesterol.  I will start her on Zetia 10 mg daily and she did try different statin to have difficulty tolerating it. 4. History of CVA no new issues.  She recovered quite nicely after that event.   Medication Adjustments/Labs and Tests Ordered: Current medicines are reviewed at length with the patient today.  Concerns regarding medicines are outlined above.  No orders of the defined types were placed in this encounter.  Medication changes: No orders of the defined types were placed in this encounter.   Signed, Park Liter, MD, [facility] 01/08/2021 1:28 PM    [facility]

## 2021-01-08 NOTE — Patient Instructions (Signed)
Medication Instructions:  Your physician has recommended you make the following change in your medication:  START TAKING [place]  *If you need a refill on your cardiac medications before your next appointment, please call your pharmacy*   Lab Work: NONE If you have labs (blood work) drawn today and your tests are completely normal, you will receive your results only by: Marland Kitchen MyChart Message (if you have MyChart) OR . A paper copy in the mail If you have any lab test that is abnormal or we need to change your treatment, we will call you to review the results.   Testing/Procedures: EKG   Follow-Up: At [facility], you and your health needs are our priority.  As part of our continuing mission to provide you with exceptional heart care, we have created designated Provider Care Teams.  These Care Teams include your primary Cardiologist (physician) and Advanced Practice Providers (APPs -  Physician Assistants and Nurse Practitioners) who all work together to provide you with the care you need, when you need it.  We recommend signing up for the patient portal called "MyChart".  Sign up information is provided on this After Visit Summary.  MyChart is used to connect with patients for Virtual Visits (Telemedicine).  Patients are able to view lab/test results, encounter notes, upcoming appointments, etc.  Non-urgent messages can be sent to your provider as well.   To learn more about what you can do with MyChart, go to [email or web address].    Your next appointment:   6 month(s)  The format for your next appointment:   In Person  Provider:   Jenne Campus, MD   Other Instructions Ezetimibe Tablets What is this medicine? EZETIMIBE (ez ET i mibe) treats high cholesterol. Ezetimibe blocks the absorption of cholesterol from the stomach. It is used with lifestyle changes, like diet and exercise. It may be used alone or with other  medicines. This medicine may be used for other purposes; ask your health care provider or pharmacist if you have questions. COMMON BRAND NAME(S): Zetia What should I tell my health care provider before I take this medicine? They need to know if you have any of these conditions:  kidney disease  liver disease  muscle cramps, pain  muscle injury  thyroid disease  an unusual or allergic reaction to ezetimibe, other medicines, foods, dyes, or preservatives  pregnant or trying to get pregnant  breast-feeding How should I use this medicine? Take this medicine by mouth. Take it as directed on the prescription label at the same time every day. You can take it with or without food. If it upsets your stomach, take it with food. Keep taking it unless your health care provider tells you to stop. Take bile acid sequestrants at a different time of day than this medicine. Take this medicine 2 hours BEFORE or 4 hours AFTER bile acid sequestrants. Talk to your health care provider about the use of this medicine in children. While it may be prescribed for children as young as 10 for selected conditions, precautions do apply. Overdosage: If you think you have taken too much of this medicine contact a poison control center or emergency room at once. NOTE: This medicine is only for you. Do not share this medicine with others. What if I miss a dose? If you miss a dose, take it as soon as you can. If it is almost time for your next dose, take only that dose. Do not take  double or extra doses. What may interact with this medicine? Do not take this medicine with any of the following medications:  fenofibrate  gemfibrozil This medicine may also interact with the following medications:  antacids  cyclosporine  herbal medicines like red yeast rice  other medicines to lower cholesterol or triglycerides This list may not describe all possible interactions. Give your health care provider a list of all  the medicines, herbs, non-prescription drugs, or dietary supplements you use. Also tell them if you smoke, drink alcohol, or use illegal drugs. Some items may interact with your medicine. What should I watch for while using this medicine? Visit your health care provider for regular checks on your progress. Tell your health care provider if your symptoms do not start to get better or if they get worse. Your health care provider may tell you to stop taking this medicine if you develop muscle problems. If your muscle problems do not go away after stopping this medicine, contact your health care provider. Do not become pregnant while taking this medicine. Women should inform their health care provider if they wish to become pregnant or think they might be pregnant. There is potential for serious harm to an unborn child. Talk to your health care provider for more information. Do not breast-feed an infant while taking this medicine. Taking this medicine is only part of a total heart healthy program. Your health care provider may give you a special diet to follow. Avoid alcohol. Avoid smoking. Ask your health care provider how much you should exercise. What side effects may I notice from receiving this medicine? Side effects that you should report to your doctor or health care provider as soon as possible:  allergic reactions (skin rash, itching or hives; swelling of the face, lips, or tongue)  liver injury (dark yellow or brown urine; general ill feeling or flu-like symptoms; loss of appetite, right upper belly pain; unusually weak or tired, yellowing of the eyes or skin)  muscle injury (dark urine; trouble passing urine or change in the amount of urine; unusually weak or tired; muscle pain; back pain) Side effects that usually do not require medical attention (report to your doctor or health care provider if they continue or are bothersome):  depressed mood  diarrhea  headache  infection (fever,  chills, cough, sore throat, pain, or trouble passing urine)  joint pain  stomach pain This list may not describe all possible side effects. Call your doctor for medical advice about side effects. You may report side effects to FDA at 1-800-FDA-1088. Where should I keep my medicine? Keep out of the reach of children and pets. Store at room temperature between 15 and 30 degrees C (59 and 86 degrees F). Protect from moisture. Get rid of any unused medicine after the expiration date. NOTE: This sheet is a summary. It may not cover all possible information. If you have questions about this medicine, talk to your doctor, pharmacist, or health care provider.  2021 Elsevier/Gold Standard (2019-06-29 17:28:51)

## 2021-01-08 NOTE — Addendum Note (Signed)
Addended by: Jerl Santos R on: 01/08/2021 01:36 PM   Modules accepted: Orders

## 2021-03-26 DIAGNOSIS — H539 Unspecified visual disturbance: Secondary | ICD-10-CM

## 2021-03-26 HISTORY — DX: Unspecified visual disturbance: H53.9

## 2021-04-27 ENCOUNTER — Other Ambulatory Visit: Payer: Self-pay | Admitting: Cardiology

## 2021-06-20 ENCOUNTER — Telehealth: Payer: Self-pay

## 2021-06-20 NOTE — Telephone Encounter (Signed)
PAF renewal for Eliquis completed and faxed

## 2021-07-25 ENCOUNTER — Other Ambulatory Visit: Payer: Self-pay | Admitting: Cardiology

## 2021-07-25 NOTE — Telephone Encounter (Signed)
Prescription refill request for Eliquis received. Indication:Afib Last office visit:6/22 PBA:[facility] Labs Age: 80 Weight:83 kg  Prescription refilled

## 2021-08-13 ENCOUNTER — Other Ambulatory Visit: Payer: Self-pay

## 2021-08-13 ENCOUNTER — Ambulatory Visit: Payer: Medicare Other | Admitting: Cardiology

## 2021-08-13 VITALS — BP 120/72 | HR 76 | Ht 62.0 in | Wt 187.4 lb

## 2021-08-13 DIAGNOSIS — I10 Essential (primary) hypertension: Secondary | ICD-10-CM

## 2021-08-13 DIAGNOSIS — I5032 Chronic diastolic (congestive) heart failure: Secondary | ICD-10-CM

## 2021-08-13 DIAGNOSIS — I25119 Atherosclerotic heart disease of native coronary artery with unspecified angina pectoris: Secondary | ICD-10-CM

## 2021-08-13 DIAGNOSIS — I480 Paroxysmal atrial fibrillation: Secondary | ICD-10-CM | POA: Diagnosis not present

## 2021-08-13 NOTE — Patient Instructions (Addendum)
Medication Instructions:  Your physician recommends that you continue on your current medications as directed. Please refer to the Current Medication list given to you today.  *If you need a refill on your cardiac medications before your next appointment, please call your pharmacy*   Lab Work: TODAY:  DIRECT LDL, CMP, & HGBA1C  If you have labs (blood work) drawn today and your tests are completely normal, you will receive your results only by: [place] (if you have MyChart) OR A paper copy in the mail If you have any lab test that is abnormal or we need to change your treatment, we will call you to review the results.   Testing/Procedures: Your physician has requested that you have an echocardiogram. Echocardiography is a painless test that uses sound waves to create images of your heart. It provides your doctor with information about the size and shape of your heart and how well your hearts chambers and valves are working. This procedure takes approximately one hour. There are no restrictions for this procedure.    Follow-Up: At [facility], you and your health needs are our priority.  As part of our continuing mission to provide you with exceptional heart care, we have created designated Provider Care Teams.  These Care Teams include your primary Cardiologist (physician) and Advanced Practice Providers (APPs -  Physician Assistants and Nurse Practitioners) who all work together to provide you with the care you need, when you need it.  We recommend signing up for the patient portal called "MyChart".  Sign up information is provided on this After Visit Summary.  MyChart is used to connect with patients for Virtual Visits (Telemedicine).  Patients are able to view lab/test results, encounter notes, upcoming appointments, etc.  Non-urgent messages can be sent to your provider as well.   To learn more about what you can do with MyChart, go to [email or web address].    Your next  appointment:   6 month(s)  The format for your next appointment:   In Person  Provider:   Jenne Campus, MD    Other Instructions

## 2021-08-13 NOTE — Telephone Encounter (Signed)
Patient portion of the application for assistance with Eliquis cost faxed.

## 2021-08-13 NOTE — Addendum Note (Signed)
Addended by: Gaetano Net on: 08/13/2021 03:43 PM   Modules accepted: Orders

## 2021-08-13 NOTE — Progress Notes (Signed)
Cardiology Office Note:    Date:  08/13/2021   ID:  Jashawna, Reever June 14, 1941, MRN [id number]  PCP:  Myrlene Broker, MD  Cardiologist:  Jenne Campus, MD    Referring MD: Myrlene Broker, MD   Chief Complaint  Patient presents with   Follow-up  I am doing fine  History of Present Illness:    Vernesha Talbot is a 81 y.o. female  with past medical history significant for paroxysmal atrial fibrillation, status post CVA, anticoagulated with Eliquis, coronary artery disease, status post coronary artery bypass graft done in [facility] in 2009, dyslipidemia with intolerance to statin. She comes today to my office for follow-up.  Overall she seems to be doing well.  She denies have any chest pain tightness squeezing pressure burning chest.  In the summertime she end up having pneumonia she was sick with COVID likely recovered.  Acute first question she got to me when I entered the room she wants to take some over-the-counter medication to help with the memory I told her that that is probably not the best idea since there is no data supporting usefulness of those medications.  Likely cardiac wise she seems to be doing well  Past Medical History:  Diagnosis Date   Abnormal nuclear stress test 09/23/2018   Acute ischemic stroke ([place]) 10/04/2018   Anxiety 01/26/2017   Arthritis    "right knee"  (03/07/2013)   Atopic dermatitis 06/03/2018   Atrial fibrillation by electrocardiography ([place]) 08/17/2019   Back pain, lumbosacral 01/26/2017   Bradycardia 08/17/2019   Breast pain, left 01/26/2017   Formatting of this note might be different from the original. diagnostic mammogram   Candidiasis of mouth and esophagus ([place]) 01/26/2017   Overview:  magic mouth wash tid  Formatting of this note might be different from the original. magic mouth wash tid   Carpal tunnel syndrome 01/26/2017   Overview:  declines further eval at this time  Formatting of this note might be different from the  original. 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 declines further eval at this time  Formatting of this note might be different from the original. declines further eval at this time   Chronic bilateral low back pain without sciatica 01/26/2017   Overview:  Lumbar spine films at [facility] of this note might be different from the original. 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   Congestive heart failure (CHF) ([place]) 08/16/2019   Contact dermatitis and other eczema due to other specified agent 01/26/2017   Coronary artery disease    Coronary artery disease involving native coronary artery of native heart with angina pectoris ([place]) 07/24/2015   Cough 01/26/2017   Displacement of lumbar disc with radiculopathy 10/04/2018   Dyslipidemia 07/24/2015   Dysphagia, pharyngoesophageal phase 01/26/2017   Esophagitis, reflux 01/26/2017   Essential hypertension 07/24/2015   Eustachian tube dysfunction, right 01/26/2017   Overview:  Decongestant and valsalva exercises. Follow up if symptoms do not progressively improve or worsen.  Formatting of this note might be different from the original. Decongestant and valsalva exercises. Follow up if symptoms do not progressively improve or worsen.  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 diagnostic mammogram at [facility]   Medicare annual wellness visit, subsequent 04/27/2019   Mild intermittent asthma 07/15/2018   Nocturnal hypoxemia 01/26/2017   Formatting of this note might be different from the original. 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 decrease metformin to 1 daily   Type II diabetes mellitus ([place])    Unspecified osteoarthritis, unspecified site 01/26/2017   Vitamin D deficiency 01/26/2017   Wheezing 01/26/2017    Past Surgical History:  Procedure Laterality Date   AXILLARY LYMPH NODE DISSECTION Right    Cataract  surgery Right 06/2020   Will have L eye done on 08/14/2020   CHOLECYSTECTOMY     CORONARY ANGIOPLASTY     CORONARY ARTERY BYPASS GRAFT  2009   "CABG X4" (03/07/2013)   MEDIAL PARTIAL KNEE REPLACEMENT Right 03/07/2013   MEDIAL PARTIAL KNEE REPLACEMENT Right 03/07/2013   Procedure: MEDIAL  PARTIAL KNEE REPLACEMENT;  Surgeon: Vickey Huger, MD;  Location: [place];  Service: Orthopedics;  Laterality: Right;   TONSILLECTOMY AND ADENOIDECTOMY  ~ 1968   VAGINAL HYSTERECTOMY      Current Medications: Current Meds  Medication Sig   albuterol (PROVENTIL) (2.5 MG/3ML) 0.083% nebulizer solution Take 2.5 mg by nebulization every 6 (six) hours as needed for wheezing or shortness of breath.   apixaban (ELIQUIS) 5 MG TABS tablet Take 5 mg by mouth 2 (two) times daily.   aspirin EC 81 MG tablet Take 81 mg by mouth daily. Swallow whole.   BIOTIN 5000 PO Take 1 tablet by mouth daily.   cholecalciferol (VITAMIN D) 1000 UNITS tablet Take 1,000 Units by mouth daily.   Cyanocobalamin (VITAMIN B 12 PO) Take 2,000 mg by mouth  daily.   diltiazem (CARDIZEM CD) 120 MG 24 hr capsule Take 1 capsule (120 mg total) by mouth daily.   fluticasone (FLONASE) 50 MCG/ACT nasal spray Place 1 spray into both nostrils 2 (two) times daily.   furosemide (LASIX) 40 MG tablet Take 40 mg by mouth daily.   lisinopril (ZESTRIL) 40 MG tablet Take 40 mg by mouth in the morning and at bedtime.   Melatonin 10 MG TABS Take 1 tablet by mouth at bedtime.   Misc Natural Products (NEURIVA PO) Take 1 tablet by mouth daily. Unknown strenght   nitroGLYCERIN (NITROSTAT) 0.4 MG SL tablet Place 0.4 mg under the tongue every 5 (five) minutes as needed for chest pain.   omeprazole (PRILOSEC) 20 MG capsule Take 1 capsule by mouth daily.   ranolazine (RANEXA) 500 MG 12 hr tablet TAKE ONE TABLET BY MOUTH TWICE DAILY (Patient taking differently: Take 500 mg by mouth 2 (two) times daily.)   traZODone (DESYREL) 100 MG tablet Take 100 mg by mouth at bedtime.   [DISCONTINUED] apixaban (ELIQUIS) 5 MG TABS tablet TAKE 1 TABLET BY MOUTH TWICE DAILY   [DISCONTINUED] furosemide (LASIX) 40 MG tablet TAKE ONE TABLET BY MOUTH ONCE DAILY AT 8am     Allergies:   Acetaminophen, Codeine, Darvocet [propoxyphene n-acetaminophen], Percocet [oxycodone-acetaminophen], Propoxyphene, and Tramadol   Social History   Socioeconomic History   Marital status: Married    Spouse name: Not on file   Number of children: Not on file   Years of education: Not on file   Highest education level: Not on file  Occupational History   Not on file  Tobacco Use   Smoking status: Never   Smokeless tobacco: Never  Substance and Sexual Activity   Alcohol use: No   Drug use: No   Sexual activity: Not Currently  Other Topics Concern   Not on file  Social History Narrative   Not on file   Social Determinants of Health   Financial Resource Strain: Not on file  Food Insecurity: Not on file  Transportation Needs: Not on file  Physical Activity: Not on file  Stress: Not on file  Social  Connections: Not on file     Family History: The patient's family history is not on file. ROS:   Please see the history of present illness.    All 14 point review of systems negative except as described per history of present illness  EKGs/Labs/Other Studies Reviewed:      Recent Labs: No results found for requested labs within last 8760 hours.  Recent Lipid Panel    Component Value Date/Time   CHOL 213 (H) 09/03/2020 1309   TRIG 102 09/03/2020 1309  HDL 55 09/03/2020 1309   CHOLHDL 3.9 09/03/2020 1309   LDLCALC 140 (H) 09/03/2020 1309    Physical Exam:    VS:  BP 120/72 (BP Location: Left Arm, Patient Position: Sitting)    Pulse 76    Ht 5\' 2"  (1.575 m)    Wt 187 lb 6.4 oz (85 kg)    SpO2 93%    BMI 34.28 kg/m     Wt Readings from Last 3 Encounters:  08/13/21 187 lb 6.4 oz (85 kg)  01/08/21 183 lb (83 kg)  08/08/20 185 lb (83.9 kg)     GEN:  Well nourished, well developed in no acute distress HEENT: Normal NECK: No JVD; No carotid bruits LYMPHATICS: No lymphadenopathy CARDIAC: RRR, no murmurs, no rubs, no gallops RESPIRATORY:  Clear to auscultation without rales, wheezing or rhonchi  ABDOMEN: Soft, non-tender, non-distended MUSCULOSKELETAL:  No edema; No deformity  SKIN: Warm and dry LOWER EXTREMITIES: no swelling NEUROLOGIC:  Alert and oriented x 3 PSYCHIATRIC:  Normal affect   ASSESSMENT:    1. Paroxysmal atrial fibrillation (HCC)   2. Coronary artery disease involving native coronary artery of native heart with angina pectoris ([place])   3. Chronic diastolic (congestive) heart failure ([place])   4. Essential hypertension    PLAN:    In order of problems listed above:  Paroxysmal atrial fibrillation she is anticoagulant which I will continue.  Denies having any episodes. Coronary artery disease stable from that point review with no chest pain. Chronic diastolic congestive heart failure she reports to have more shortness of breath since her COVID infection  which undoubtedly is related to her COVID however I will do echocardiogram to check make sure she does not have any cardiomyopathy Dyslipidemia she is off any medications intolerant to statin will check fasting lipid profile she told me she see her primary care physician very rarely therefore I will use this opportunity to check her hemoglobin A1c as well as complete metabolic panel   Medication Adjustments/Labs and Tests Ordered: Current medicines are reviewed at length with the patient today.  Concerns regarding medicines are outlined above.  Orders Placed This Encounter  Procedures   ECHOCARDIOGRAM COMPLETE   Medication changes: No orders of the defined types were placed in this encounter.   Signed, Park Liter, MD, [facility] 08/13/2021 3:38 PM    [place]

## 2021-08-14 LAB — COMPREHENSIVE METABOLIC PANEL
ALT: 12 IU/L (ref 0–32)
AST: 16 IU/L (ref 0–40)
Albumin/Globulin Ratio: 1.7 (ref 1.2–2.2)
Albumin: 4.5 g/dL (ref 3.7–4.7)
Alkaline Phosphatase: 97 IU/L (ref 44–121)
BUN/Creatinine Ratio: 17 (ref 12–28)
BUN: 12 mg/dL (ref 8–27)
Bilirubin Total: <0.2 mg/dL (ref 0.0–1.2)
CO2: 24 mmol/L (ref 20–29)
Calcium: 9.8 mg/dL (ref 8.7–10.3)
Chloride: 99 mmol/L (ref 96–106)
Creatinine, Ser: 0.69 mg/dL (ref 0.57–1.00)
Globulin, Total: 2.6 g/dL (ref 1.5–4.5)
Glucose: 110 mg/dL — ABNORMAL HIGH (ref 70–99)
Potassium: 4.2 mmol/L (ref 3.5–5.2)
Sodium: 137 mmol/L (ref 134–144)
Total Protein: 7.1 g/dL (ref 6.0–8.5)
eGFR: 89 mL/min/{1.73_m2} (ref >59)

## 2021-08-14 LAB — HEMOGLOBIN A1C
Est. average glucose Bld gHb Est-mCnc: 143 mg/dL
Hgb A1c MFr Bld: 6.6 % — ABNORMAL HIGH (ref 4.8–5.6)

## 2021-08-14 LAB — LDL CHOLESTEROL, DIRECT: LDL Direct: 119 mg/dL — ABNORMAL HIGH (ref 0–99)

## 2021-08-19 ENCOUNTER — Telehealth: Payer: Self-pay

## 2021-08-19 NOTE — Telephone Encounter (Signed)
-----   Message from Park Liter, MD sent at 08/15/2021  8:59 AM EST ----- All laboratory tests are looking good except cholesterol mildly elevated.  I know she is intolerant to statin.  If she is willing to try Zetia 10 please send prescription for her

## 2021-08-19 NOTE — Telephone Encounter (Signed)
Attempt to reach the patient, no response, line is busy. I will send a letter requesting a call back.

## 2021-08-21 ENCOUNTER — Ambulatory Visit (INDEPENDENT_AMBULATORY_CARE_PROVIDER_SITE_OTHER): Payer: Medicare Other

## 2021-08-21 ENCOUNTER — Other Ambulatory Visit: Payer: Self-pay

## 2021-08-21 DIAGNOSIS — I480 Paroxysmal atrial fibrillation: Secondary | ICD-10-CM

## 2021-08-21 LAB — ECHOCARDIOGRAM COMPLETE
Area-P 1/2: 3.45 cm2
P 1/2 time: 515 msec
S' Lateral: 3.2 cm

## 2021-08-23 NOTE — Telephone Encounter (Signed)
Patient calling back for results. Please advise  

## 2021-08-27 NOTE — Telephone Encounter (Signed)
Returned patient call, unable to reach or LM. Will continue with efforts.

## 2021-09-03 NOTE — Telephone Encounter (Signed)
Per Dr. Raliegh Ip: We will talk about this next time when she will be in the office next time   Patient notified , she said she will call back for an  appt.

## 2021-10-23 ENCOUNTER — Other Ambulatory Visit: Payer: Self-pay | Admitting: Cardiology

## 2022-04-03 ENCOUNTER — Telehealth: Payer: Self-pay

## 2022-04-03 NOTE — Telephone Encounter (Signed)
[employer] approved letter under [employer]

## 2022-05-29 ENCOUNTER — Ambulatory Visit: Payer: Medicare Other | Attending: Cardiology | Admitting: Cardiology

## 2022-05-29 ENCOUNTER — Encounter: Payer: Self-pay | Admitting: Cardiology

## 2022-05-29 VITALS — BP 158/78 | HR 75 | Ht 61.6 in | Wt 191.0 lb

## 2022-05-29 DIAGNOSIS — I351 Nonrheumatic aortic (valve) insufficiency: Secondary | ICD-10-CM

## 2022-05-29 DIAGNOSIS — I10 Essential (primary) hypertension: Secondary | ICD-10-CM | POA: Diagnosis not present

## 2022-05-29 DIAGNOSIS — I25119 Atherosclerotic heart disease of native coronary artery with unspecified angina pectoris: Secondary | ICD-10-CM | POA: Diagnosis not present

## 2022-05-29 DIAGNOSIS — R0609 Other forms of dyspnea: Secondary | ICD-10-CM

## 2022-05-29 DIAGNOSIS — I480 Paroxysmal atrial fibrillation: Secondary | ICD-10-CM | POA: Diagnosis not present

## 2022-05-29 DIAGNOSIS — I5032 Chronic diastolic (congestive) heart failure: Secondary | ICD-10-CM

## 2022-05-29 NOTE — Progress Notes (Signed)
Cardiology Office Note:    Date:  05/29/2022   ID:  [name], [name] [birth year], MRN [id number]  PCP:  Myrlene Broker, MD  Cardiologist:  Jenne Campus, MD    Referring MD: Myrlene Broker, MD   No chief complaint on file. I have some shortness of breath  History of Present Illness:    Patricia Dawson is a 81 y.o. female with past medical history significant for paroxysmal atrial fibrillation, status post CVA, anticoag with Eliquis, coronary artery disease, status post coronary bypass graft done in [facility] in 2009, dyslipidemia, intolerance to statin.  She comes today 2 months for follow-up she complain of having some more shortness of breath than before this is something that is gradually creeping up on her.  Denies have any chest pain tightness squeezing pressure burning chest shortness of breath.  Past Medical History:  Diagnosis Date   Abnormal nuclear stress test 09/23/2018   Acute ischemic stroke ([place]) 10/04/2018   Anxiety 01/26/2017   Arthritis    "right knee"  (03/07/2013)   Atopic dermatitis 06/03/2018   Atrial fibrillation by electrocardiography ([place]) 08/17/2019   Back pain, lumbosacral 01/26/2017   Bradycardia 08/17/2019   Breast pain, left 01/26/2017   Formatting of this note might be different from the original. diagnostic mammogram   Candidiasis of mouth and esophagus ([place]) 01/26/2017   Overview:  magic mouth wash tid  Formatting of this note might be different from the original. magic mouth wash tid   Carpal tunnel syndrome 01/26/2017   Overview:  declines further eval at this time  Formatting of this note might be different from the original. 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 declines further eval at this time  Formatting of this note might be different from the original. declines further  eval at this time   Cerebral infarction due to embolism of left middle cerebral artery ([place]) 06/06/2020   Chronic bilateral low back pain without sciatica 01/26/2017   Overview:  Lumbar spine films at [facility] of this note might be different from the original. 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 Follow up if symptoms do not progressively improve or worsen.  Formatting of this note might be different from the original. Decongestant and valsalva exercises. Follow up if symptoms do not progressively improve or worsen.   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 diagnostic mammogram at [facility]   Medicare annual wellness visit, subsequent 04/27/2019   Mild intermittent asthma 07/15/2018   Nocturnal hypoxemia 01/26/2017   Formatting of this note might be different from the original. arrange O2 at night through [place] Patient   Nonrheumatic aortic valve insufficiency 07/24/2015   Overview:  Overview:  Mild Overview:  Mild  Formatting of this note might be different from the original. 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   Preoperative cardiovascular examination 07/24/2015   Reactive airway disease with wheezing 01/26/2017   Overview:  arrange O2 at night through [place] Patient   Rhinosinusitis 01/26/2017   S/P right unicompartmental knee replacement 05/05/2013   Sciatica, left side 09/24/2018   Sebaceous cyst 01/26/2017   Senile osteoporosis 01/26/2017   Sinus headache    "often" (03/07/2013)   Spinal stenosis, lumbar region with neurogenic claudication  09/24/2018   Spondylolisthesis, lumbar region 09/24/2018   Status post coronary artery bypass graft 2009 at [facility] 08/16/2019   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 decrease metformin to 1 daily   Type II diabetes mellitus ([place])    Unspecified osteoarthritis, unspecified site 01/26/2017   Visual changes 03/26/2021   Vitamin D deficiency 01/26/2017   Wheezing 01/26/2017    Past Surgical History:  Procedure Laterality Date   AXILLARY LYMPH NODE DISSECTION Right    Cataract  surgery Right 06/2020   Will have L eye done on 08/14/2020   CHOLECYSTECTOMY     CORONARY ANGIOPLASTY     CORONARY ARTERY BYPASS GRAFT  2009   "CABG X4" (03/07/2013)   MEDIAL PARTIAL KNEE REPLACEMENT Right 03/07/2013   MEDIAL PARTIAL KNEE REPLACEMENT Right 03/07/2013   Procedure: MEDIAL PARTIAL KNEE REPLACEMENT;  Surgeon: Vickey Huger, MD;  Location: [place];  Service: Orthopedics;  Laterality: Right;   TONSILLECTOMY AND ADENOIDECTOMY  ~ 1968   VAGINAL HYSTERECTOMY      Current Medications: Current Meds  Medication Sig  albuterol (PROVENTIL) (2.5 MG/3ML) 0.083% nebulizer solution Take 2.5 mg by nebulization every 6 (six) hours as needed for wheezing or shortness of breath.   apixaban (ELIQUIS) 5 MG TABS tablet Take 5 mg by mouth 2 (two) times daily.   BIOTIN 5000 PO Take 1 tablet by mouth daily.   cholecalciferol (VITAMIN D) 1000 UNITS tablet Take 1,000 Units by mouth daily.   Cyanocobalamin (VITAMIN B 12 PO) Take 2,000 mg by mouth daily.   diltiazem (CARDIZEM CD) 120 MG 24 hr capsule Take 1 capsule (120 mg total) by mouth daily.   furosemide (LASIX) 40 MG tablet Take 40 mg by mouth daily.   ipratropium (ATROVENT) 0.02 % nebulizer solution Take 0.2 mg by nebulization 4 (four) times daily.   lisinopril  (ZESTRIL) 40 MG tablet Take 40 mg by mouth in the morning and at bedtime.   Melatonin 10 MG TABS Take 1 tablet by mouth at bedtime.   Misc Natural Products (NEURIVA PO) Take 1 tablet by mouth daily. Unknown strenght   nitroGLYCERIN (NITROSTAT) 0.4 MG SL tablet Place 0.4 mg under the tongue every 5 (five) minutes as needed for chest pain.   omeprazole (PRILOSEC) 20 MG capsule Take 1 capsule by mouth daily.   ranolazine (RANEXA) 500 MG 12 hr tablet TAKE ONE TABLET BY MOUTH TWICE DAILY     Allergies:   Acetaminophen, Codeine, Darvocet [propoxyphene n-acetaminophen], Percocet [oxycodone-acetaminophen], Propoxyphene, and Tramadol   Social History   Socioeconomic History   Marital status: Married    Spouse name: Not on file   Number of children: Not on file   Years of education: Not on file   Highest education level: Not on file  Occupational History   Not on file  Tobacco Use   Smoking status: Never   Smokeless tobacco: Never  Substance and Sexual Activity   Alcohol use: No   Drug use: No   Sexual activity: Not Currently  Other Topics Concern   Not on file  Social History Narrative   Not on file   Social Determinants of Health   Financial Resource Strain: Not on file  Food Insecurity: Not on file  Transportation Needs: Not on file  Physical Activity: Not on file  Stress: Not on file  Social Connections: Not on file     Family History: The patient's family history is not on file. ROS:   Please see the history of present illness.    All 14 point review of systems negative except as described per history of present illness  EKGs/Labs/Other Studies Reviewed:      Recent Labs: 08/13/2021: ALT 12; BUN 12; Creatinine, Ser 0.69; Potassium 4.2; Sodium 137  Recent Lipid Panel    Component Value Date/Time   CHOL 213 (H) 09/03/2020 1309   TRIG 102 09/03/2020 1309   HDL 55 09/03/2020 1309   CHOLHDL 3.9 09/03/2020 1309   LDLCALC 140 (H) 09/03/2020 1309   LDLDIRECT 119 (H)  08/13/2021 1555    Physical Exam:    VS:  BP (!) 158/78   Pulse 75   Ht 5' 1.6" (1.565 m)   Wt 191 lb (86.6 kg)   SpO2 94%   BMI 35.39 kg/m     Wt Readings from Last 3 Encounters:  05/29/22 191 lb (86.6 kg)  08/13/21 187 lb 6.4 oz (85 kg)  01/08/21 183 lb (83 kg)     GEN:  Well nourished, well developed in no acute distress HEENT: Normal NECK: No JVD; No carotid bruits LYMPHATICS:  No lymphadenopathy CARDIAC: RRR, no murmurs, no rubs, no gallops RESPIRATORY:  Clear to auscultation without rales, wheezing or rhonchi  ABDOMEN: Soft, non-tender, non-distended MUSCULOSKELETAL:  No edema; No deformity  SKIN: Warm and dry LOWER EXTREMITIES: no swelling NEUROLOGIC:  Alert and oriented x 3 PSYCHIATRIC:  Normal affect   ASSESSMENT:    1. Paroxysmal atrial fibrillation (HCC)   2. Dyspnea on exertion   3. Coronary artery disease involving native coronary artery of native heart with angina pectoris ([place])   4. Essential hypertension   5. Nonrheumatic aortic valve insufficiency   6. Chronic diastolic (congestive) heart failure (HCC)    PLAN:    In order of problems listed above:  Paroxysmal atrial fibrillation, denies having any.  Anticoagulated which we will continue. Dyspnea on exertion concerning.  I wanted her to have echocardiogram done and she agreed to have it also wanted to do evaluation for coronary artery disease however she does not want to have a stress test done.  Therefore we had a long discussion about the situation.  Will wait for results of echocardiogram and then decide how we can evaluate her for presence of coronary artery disease.  If she disagree with the test then we will simply try empirically medical therapy. Essential hypertension blood pressure well controlled today continue present management None rheumatic aortic valve insufficiency we will continue monitoring echocardiogram will be done   Medication Adjustments/Labs and Tests Ordered: Current  medicines are reviewed at length with the patient today.  Concerns regarding medicines are outlined above.  Orders Placed This Encounter  Procedures   EKG 12-Lead   ECHOCARDIOGRAM COMPLETE   Medication changes: No orders of the defined types were placed in this encounter.   Signed, Park Liter, MD, [facility] 05/29/2022 5:41 PM    [place]

## 2022-05-29 NOTE — Patient Instructions (Signed)
Medication Instructions:  Your physician recommends that you continue on your current medications as directed. Please refer to the Current Medication list given to you today.  *If you need a refill on your cardiac medications before your next appointment, please call your pharmacy*   Lab Work: None Ordered If you have labs (blood work) drawn today and your tests are completely normal, you will receive your results only by: [place] (if you have MyChart) OR A paper copy in the mail If you have any lab test that is abnormal or we need to change your treatment, we will call you to review the results.   Testing/Procedures: Your physician has requested that you have an echocardiogram. Echocardiography is a painless test that uses sound waves to create images of your heart. It provides your doctor with information about the size and shape of your heart and how well your heart's chambers and valves are working. This procedure takes approximately one hour. There are no restrictions for this procedure. Please do NOT wear cologne, perfume, aftershave, or lotions (deodorant is allowed). Please arrive 15 minutes prior to your appointment time.    Follow-Up: At [facility], you and your health needs are our priority.  As part of our continuing mission to provide you with exceptional heart care, we have created designated Provider Care Teams.  These Care Teams include your primary Cardiologist (physician) and Advanced Practice Providers (APPs -  Physician Assistants and Nurse Practitioners) who all work together to provide you with the care you need, when you need it.  We recommend signing up for the patient portal called "MyChart".  Sign up information is provided on this After Visit Summary.  MyChart is used to connect with patients for Virtual Visits (Telemedicine).  Patients are able to view lab/test results, encounter notes, upcoming appointments, etc.  Non-urgent messages can be sent to your  provider as well.   To learn more about what you can do with MyChart, go to [email or web address].    Your next appointment:   3 month(s)  The format for your next appointment:   In Person  Provider:   Jenne Campus, MD    Other Instructions NA

## 2022-06-10 ENCOUNTER — Telehealth: Payer: Self-pay

## 2022-06-10 NOTE — Telephone Encounter (Signed)
Assistance application for Eliquis mailed to the patient for 2024 renewal

## 2022-06-16 ENCOUNTER — Ambulatory Visit: Payer: Medicare Other | Attending: Cardiology

## 2022-06-16 DIAGNOSIS — R0609 Other forms of dyspnea: Secondary | ICD-10-CM | POA: Diagnosis not present

## 2022-06-16 LAB — ECHOCARDIOGRAM COMPLETE
Area-P 1/2: 3.27 cm2
P 1/2 time: 489 msec
S' Lateral: 2.7 cm

## 2022-06-18 ENCOUNTER — Telehealth: Payer: Self-pay | Admitting: Cardiology

## 2022-06-18 NOTE — Telephone Encounter (Signed)
Results reviewed with pt as per Dr. Krasowski's note.  Pt verbalized understanding and had no additional questions. Routed to PCP  

## 2022-06-18 NOTE — Telephone Encounter (Signed)
Patient is calling for results to her echo.  

## 2022-07-17 ENCOUNTER — Telehealth: Payer: Self-pay

## 2022-07-17 NOTE — Telephone Encounter (Signed)
Spoke with patient, she has received the forms I mailed out however she is awaiting proof of income. She will mail out as soon as she collected all information needed to assist with the application.

## 2022-07-19 ENCOUNTER — Other Ambulatory Visit: Payer: Self-pay | Admitting: Cardiology

## 2022-08-13 ENCOUNTER — Telehealth: Payer: Self-pay

## 2022-08-13 NOTE — Telephone Encounter (Signed)
I spoke with the patient, she said she just received the necessary document to finalize her application and plans to mail this shortly.

## 2022-08-27 ENCOUNTER — Telehealth: Payer: Self-pay

## 2022-08-27 NOTE — Telephone Encounter (Signed)
Received BMS application, this has been faxed

## 2022-09-02 ENCOUNTER — Ambulatory Visit: Payer: Medicare Other | Attending: Cardiology | Admitting: Cardiology

## 2022-09-02 VITALS — BP 168/90 | HR 72 | Ht 62.0 in | Wt 187.2 lb

## 2022-09-02 DIAGNOSIS — I5032 Chronic diastolic (congestive) heart failure: Secondary | ICD-10-CM | POA: Diagnosis not present

## 2022-09-02 DIAGNOSIS — I25119 Atherosclerotic heart disease of native coronary artery with unspecified angina pectoris: Secondary | ICD-10-CM | POA: Diagnosis not present

## 2022-09-02 DIAGNOSIS — I10 Essential (primary) hypertension: Secondary | ICD-10-CM

## 2022-09-02 DIAGNOSIS — E785 Hyperlipidemia, unspecified: Secondary | ICD-10-CM

## 2022-09-02 MED ORDER — DILTIAZEM HCL ER 240 MG PO TB24: 240.0000 mg | ORAL_TABLET | Freq: Every day | ORAL | 3 refills | Status: DC

## 2022-09-02 NOTE — Progress Notes (Signed)
Cardiology Office Note:    Date:  09/02/2022   ID:  Patricia, Dawson 1941-01-28, MRN [id number]  PCP:  Myrlene Broker, MD  Cardiologist:  Jenne Campus, MD    Referring MD: Myrlene Broker, MD   Chief Complaint  Patient presents with   Hypertension    History of Present Illness:    Patricia Dawson is a 82 y.o. female with past medical history significant for paroxysmal atrial fibrillation, status post CVA, she is anticoag with Eliquis, coronary artery disease status post coronary bypass graft and hypervigilance from 2009, dyslipidemia, intolerant to statin and refused to take any medications now. Last time I seen her she was complaining of having some shortness of breath, we did echocardiogram to assess left ventricle ejection fraction which was fine, plan was also to pursue ischemia evaluation however in the meantime she was discovered to have bronchial asthma she was giving some bronchodilators with very good response that she is feeling good.  She denies have any shortness of breath chest pain tightness squeezing pressure burning chest.  Overall she is very happy where she is.  Past Medical History:  Diagnosis Date   Abnormal nuclear stress test 09/23/2018   Acute ischemic stroke ([place]) 10/04/2018   Anxiety 01/26/2017   Arthritis    "right knee"  (03/07/2013)   Atopic dermatitis 06/03/2018   Atrial fibrillation by electrocardiography ([place]) 08/17/2019   Back pain, lumbosacral 01/26/2017   Bradycardia 08/17/2019   Breast pain, left 01/26/2017   Formatting of this note might be different from the original. diagnostic mammogram   Candidiasis of mouth and esophagus ([place]) 01/26/2017   Overview:  magic mouth wash tid  Formatting of this note might be different from the original. magic mouth wash tid   Carpal tunnel syndrome 01/26/2017   Overview:  declines further eval at this time  Formatting of this note might be different from the original. declines further eval at this time   Carpal  tunnel syndrome of right wrist 01/26/2017   Overview:  declines further eval at this time  Formatting of this note might be different from the original. declines further eval at this time  Formatting of this note might be different from the original. declines further eval at this time   Cerebral infarction due to embolism of left middle cerebral artery ([place]) 06/06/2020   Chronic bilateral low back pain without sciatica 01/26/2017   Overview:  Lumbar spine films at [facility] of this note might be different from the original. 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   Congestive heart failure (CHF) ([place]) 08/16/2019   Contact dermatitis and other eczema due to other specified agent 01/26/2017   Coronary artery disease    Coronary artery disease involving native coronary artery of native heart with angina pectoris ([place]) 07/24/2015   Cough 01/26/2017   Displacement of lumbar disc with radiculopathy 10/04/2018   Dyslipidemia 07/24/2015   Dysphagia, pharyngoesophageal phase 01/26/2017   Esophagitis, reflux 01/26/2017   Essential hypertension 07/24/2015   Eustachian tube dysfunction, right 01/26/2017   Overview:  Decongestant and valsalva exercises. Follow up if symptoms do not progressively improve or worsen.  Formatting of this note might be different from the original. Decongestant and valsalva exercises. Follow up if symptoms do not progressively improve or worsen.   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 diagnostic mammogram at [facility]   Medicare annual wellness visit, subsequent 04/27/2019   Mild intermittent asthma 07/15/2018   Nocturnal hypoxemia 01/26/2017   Formatting of this note might be different from the original. arrange O2 at night through [place] Patient   Nonrheumatic aortic valve insufficiency 07/24/2015   Overview:  Overview:  Mild Overview:  Mild  Formatting of this note might be different from the original. Overview:  Mild   Obesity (BMI 30-39.9) 11/17/2019   OSA (obstructive sleep apnea)    "tried off and on for several years to wear mask; I can't" (03/07/2013)   Overweight 01/26/2017   Paroxysmal atrial fibrillation (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 decrease metformin to 1 daily   Type II diabetes mellitus ([place])    Unspecified osteoarthritis, unspecified site 01/26/2017   Visual changes 03/26/2021   Vitamin D deficiency 01/26/2017   Wheezing 01/26/2017    Past Surgical History:  Procedure Laterality Date   AXILLARY LYMPH NODE DISSECTION Right    Cataract  surgery Right 06/2020   Will have L eye done on 08/14/2020   CHOLECYSTECTOMY     CORONARY ANGIOPLASTY     CORONARY ARTERY BYPASS GRAFT  2009   "CABG X4" (03/07/2013)   MEDIAL PARTIAL KNEE REPLACEMENT Right 03/07/2013   MEDIAL PARTIAL KNEE REPLACEMENT Right 03/07/2013   Procedure:  MEDIAL PARTIAL KNEE REPLACEMENT;  Surgeon: Vickey Huger, MD;  Location: [facility];  Service: Orthopedics;  Laterality: Right;   TONSILLECTOMY AND ADENOIDECTOMY  ~ 1968   VAGINAL HYSTERECTOMY      Current Medications: Current Meds  Medication Sig   albuterol (PROVENTIL) (2.5 MG/3ML) 0.083% nebulizer solution Take 2.5 mg by nebulization every 6 (six) hours as needed for wheezing or shortness of breath.   apixaban (ELIQUIS) 5 MG TABS tablet Take 5 mg by mouth 2 (two) times daily.   BIOTIN 5000 PO Take 1 tablet by mouth daily.   cholecalciferol (VITAMIN D) 1000 UNITS tablet Take 1,000 Units by mouth daily.   Cyanocobalamin (VITAMIN B 12 PO) Take 2,000 mg by mouth daily.    Dextromethorphan-guaiFENesin (MUCINEX FAST-MAX DM MAX PO) Take 1 capsule by mouth as needed (congestion).   diltiazem (CARDIZEM CD) 120 MG 24 hr capsule Take 1 capsule (120 mg total) by mouth daily.   furosemide (LASIX) 40 MG tablet Take 40 mg by mouth daily.   ipratropium (ATROVENT) 0.02 % nebulizer solution Take 0.2 mg by nebulization 4 (four) times daily.   lisinopril (ZESTRIL) 40 MG tablet Take 40 mg by mouth in the morning and at bedtime.   Melatonin 10 MG TABS Take 1 tablet by mouth at bedtime.   metFORMIN (GLUCOPHAGE) 500 MG tablet Take 500 mg by mouth 2 (two) times daily with a meal.   Misc Natural Products (NEURIVA PO) Take 1 tablet by mouth daily. Unknown strenght   nitroGLYCERIN (NITROSTAT) 0.4 MG SL tablet Place 0.4 mg under the tongue every 5 (five) minutes as needed for chest pain.   omeprazole (PRILOSEC) 20 MG capsule Take 1 capsule by mouth daily.   ranolazine (RANEXA) 500 MG 12 hr tablet TAKE ONE TABLET BY MOUTH TWICE DAILY     Allergies:   Acetaminophen, Codeine, Darvocet [propoxyphene n-acetaminophen], Percocet [oxycodone-acetaminophen], Propoxyphene, and Tramadol   Social History   Socioeconomic History   Marital status: Married    Spouse name: Not on file   Number of children: Not on file   Years of education: Not on file   Highest education level: Not on file  Occupational History   Not on file  Tobacco Use   Smoking status: Never   Smokeless tobacco: Never  Substance and Sexual Activity   Alcohol use: No   Drug use: No   Sexual activity: Not Currently  Other Topics Concern   Not on file  Social History Narrative   Not on file   Social Determinants of Health   Financial Resource Strain: Not on file  Food Insecurity: Not on file  Transportation Needs: Not on file  Physical Activity: Not on file  Stress: Not on file  Social Connections: Not on file     Family History: The patient's family history is not on file. ROS:   Please see the history of  present illness.    All 14 point review of systems negative except as described per history of present illness  EKGs/Labs/Other Studies Reviewed:      Recent Labs: No results found for requested labs within last 365 days.  Recent Lipid Panel    Component Value Date/Time   CHOL 213 (H) 09/03/2020 1309   TRIG 102 09/03/2020 1309   HDL 55 09/03/2020 1309   CHOLHDL 3.9 09/03/2020 1309   LDLCALC 140 (H) 09/03/2020 1309   LDLDIRECT 119 (H) 08/13/2021 1555    Physical Exam:    VS:  BP (!) 168/90 (BP Location:  Left Arm, Patient Position: Sitting)   Pulse 72   Ht '5\' 2"'$  (1.575 m)   Wt 187 lb 3.2 oz (84.9 kg)   SpO2 98%   BMI 34.24 kg/m     Wt Readings from Last 3 Encounters:  09/02/22 187 lb 3.2 oz (84.9 kg)  05/29/22 191 lb (86.6 kg)  08/13/21 187 lb 6.4 oz (85 kg)     GEN:  Well nourished, well developed in no acute distress HEENT: Normal NECK: No JVD; No carotid bruits LYMPHATICS: No lymphadenopathy CARDIAC: RRR, no murmurs, no rubs, no gallops RESPIRATORY:  Clear to auscultation without rales, wheezing or rhonchi  ABDOMEN: Soft, non-tender, non-distended MUSCULOSKELETAL:  No edema; No deformity  SKIN: Warm and dry LOWER EXTREMITIES: no swelling NEUROLOGIC:  Alert and oriented x 3 PSYCHIATRIC:  Normal affect   ASSESSMENT:    1. Chronic diastolic (congestive) heart failure ([place])   2. Coronary artery disease involving native coronary artery of native heart with angina pectoris ([place])   3. Essential hypertension   4. Dyslipidemia    PLAN:    In order of problems listed above:  Chronic diastolic congestive heart 3 seems to be compensated and physical we will continue present management. Coronary artery disease.  Denies have any signs and symptoms indicate reactivation of the problem continue present management. Paroxysmal atrial fibrillation.  She is anticoag with Eliquis which I will continue. Dyslipidemia I did review her K PN as well as lumbar test done by  primary care physician last number I have is from February 2023 with LDL 124 HDL 55.  Again I start talking to her about potentially taking some cholesterol medication, she clearly with no hesitation does not want to.  Will talk about potentially injectable medication she does not want to.  She says she is happy where she is. Essential hypertension: Uncontrolled.  Will check EKG if EKG is fine we will double the dose of Cardizem to 240 daily.   Medication Adjustments/Labs and Tests Ordered: Current medicines are reviewed at length with the patient today.  Concerns regarding medicines are outlined above.  No orders of the defined types were placed in this encounter.  Medication changes: No orders of the defined types were placed in this encounter.   Signed, Park Liter, MD, [facility] 09/02/2022 1:26 PM    [facility]

## 2022-09-02 NOTE — Addendum Note (Signed)
Addended by: Jacobo Forest D on: 09/02/2022 01:44 PM   Modules accepted: Orders

## 2022-09-02 NOTE — Patient Instructions (Signed)
Medication Instructions:    INCREASE: Cardizem to '240mg'$  daily- you may double your dose of your current medication and your next reefill will reflect your new dose   Lab Work: None Ordered If you have labs (blood work) drawn today and your tests are completely normal, you will receive your results only by: [place] (if you have MyChart) OR A paper copy in the mail If you have any lab test that is abnormal or we need to change your treatment, we will call you to review the results.   Testing/Procedures: None Ordered   Follow-Up: At [facility], you and your health needs are our priority.  As part of our continuing mission to provide you with exceptional heart care, we have created designated Provider Care Teams.  These Care Teams include your primary Cardiologist (physician) and Advanced Practice Providers (APPs -  Physician Assistants and Nurse Practitioners) who all work together to provide you with the care you need, when you need it.  We recommend signing up for the patient portal called "MyChart".  Sign up information is provided on this After Visit Summary.  MyChart is used to connect with patients for Virtual Visits (Telemedicine).  Patients are able to view lab/test results, encounter notes, upcoming appointments, etc.  Non-urgent messages can be sent to your provider as well.   To learn more about what you can do with MyChart, go to [email or web address].    Your next appointment:   6 month(s)  The format for your next appointment:   In Person  Provider:   Jenne Campus, MD    Other Instructions NA

## 2022-10-06 ENCOUNTER — Telehealth: Payer: Self-pay | Admitting: Cardiology

## 2022-10-06 ENCOUNTER — Telehealth: Payer: Self-pay

## 2022-10-06 NOTE — Telephone Encounter (Signed)
Patient is aware of application status and need to meet [phone] OOP to receive assistance

## 2022-10-06 NOTE — Telephone Encounter (Signed)
Pt recently tested Covid + and the PCP prescribed her Paxlovid and stated that the pt would need to hold their Eliquis for 5 days. Pt would like to know if the should hold the Eliquis and take the Paxlovid instead or if she should avoid taking the Paxlovid and keep taking the Eliquis. Please advise.

## 2022-10-06 NOTE — Telephone Encounter (Signed)
Pt c/o medication issue:  1. Name of Medication: apixaban (ELIQUIS) 5 MG TABS tablet   2. How are you currently taking this medication (dosage and times per day)? Take 5 mg by mouth 2 (two) times daily.   3. Are you having a reaction (difficulty breathing--STAT)? No  4. What is your medication issue? Pt recently tested Covid + and the PCP prescribed her Paxlovid and stated that the pt would need to hold their Eliquis for 5 days. Pt would like to know if the should hold the Eliquis and take the Paxlovid instead or if the should avoid taking the Paxlovid and keep taking the Eliquis.

## 2022-10-09 NOTE — Telephone Encounter (Signed)
Patient decided not to take the Paxlovid and did not fill that prescription. She has remained on her normal dose of Eliquis at this time. Patient had no further questions at this time.

## 2023-03-03 ENCOUNTER — Encounter: Payer: Self-pay | Admitting: Cardiology

## 2023-03-03 ENCOUNTER — Ambulatory Visit: Payer: Medicare Other | Attending: Cardiology | Admitting: Cardiology

## 2023-03-03 VITALS — BP 134/56 | HR 79 | Ht 62.0 in | Wt 185.0 lb

## 2023-03-03 DIAGNOSIS — E785 Hyperlipidemia, unspecified: Secondary | ICD-10-CM | POA: Diagnosis not present

## 2023-03-03 DIAGNOSIS — J4520 Mild intermittent asthma, uncomplicated: Secondary | ICD-10-CM | POA: Diagnosis not present

## 2023-03-03 DIAGNOSIS — I5032 Chronic diastolic (congestive) heart failure: Secondary | ICD-10-CM | POA: Diagnosis not present

## 2023-03-03 DIAGNOSIS — I25119 Atherosclerotic heart disease of native coronary artery with unspecified angina pectoris: Secondary | ICD-10-CM

## 2023-03-03 NOTE — Patient Instructions (Signed)

## 2023-03-03 NOTE — Progress Notes (Signed)
Cardiology Office Note:    Date:  03/03/2023   ID:  [name], [name] [birth year], MRN [id number]  PCP:  Patricia Pen, MD  Cardiologist:  Patricia Balsam, MD    Referring MD: Patricia Pen, MD   Chief Complaint  Patient presents with   Follow-up    History of Present Illness:    Patricia Dawson is a 82 y.o. female   with past medical history significant for paroxysmal atrial fibrillation, status post CVA, she is anticoag with Eliquis, coronary artery disease status post coronary bypass graft and hypervigilance from 2009, dyslipidemia, intolerant to statin and refused to take any medications now. Last time I seen her she was complaining of having some shortness of breath, we did echocardiogram to assess left ventricle ejection fraction which was fine, plan was also to pursue ischemia evaluation however in the meantime she was discovered to have bronchial asthma she was giving some bronchodilators with very good response that she is feeling good. Comes today to my office for follow-up.  Overall doing well.  She said breathing is fine she does have difficulty exercising walking around mostly because of chronic back problem, she did have surgery there.  Denies have any chest pain tightness squeezing pressure burning chest overall seems to be doing well  Past Medical History:  Diagnosis Date   Abnormal nuclear stress test 09/23/2018   Acute ischemic stroke (HCC) 10/04/2018   Anxiety 01/26/2017   Arthritis    "right knee"  (03/07/2013)   Atopic dermatitis 06/03/2018   Atrial fibrillation by electrocardiography (HCC) 08/17/2019   Back pain, lumbosacral 01/26/2017   Bradycardia 08/17/2019   Breast pain, left 01/26/2017   Formatting of this note might be different from the original. diagnostic mammogram   Candidiasis of mouth and esophagus (HCC) 01/26/2017   Overview:  magic mouth wash tid  Formatting of this note might be different from the original. magic mouth wash tid   Carpal tunnel  syndrome 01/26/2017   Overview:  declines further eval at this time  Formatting of this note might be different from the original. 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 declines further eval at this time  Formatting of this note might be different from the original. declines further eval at this time   Cerebral infarction due to embolism of left middle cerebral artery (HCC) 06/06/2020   Chronic bilateral low back pain without sciatica 01/26/2017   Overview:  Lumbar spine films at [facility] of this note might be different from the original. 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 Follow up if symptoms do not progressively improve or worsen.  Formatting of this note might be different from the original. Decongestant and valsalva exercises. Follow up if symptoms do not progressively improve or worsen.   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 diagnostic mammogram at [facility]   Medicare annual wellness visit, subsequent 04/27/2019   Mild intermittent asthma 07/15/2018   Nocturnal hypoxemia 01/26/2017   Formatting of this note might be different from the original. 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 decrease metformin to 1 daily   Type II diabetes mellitus (HCC)    Unspecified osteoarthritis, unspecified site 01/26/2017   Visual changes 03/26/2021   Vitamin D deficiency 01/26/2017   Wheezing 01/26/2017    Past Surgical History:  Procedure Laterality Date   AXILLARY LYMPH NODE DISSECTION Right    Cataract  surgery Right 06/2020   Will have L eye done on 08/14/2020   CHOLECYSTECTOMY     CORONARY ANGIOPLASTY  CORONARY ARTERY BYPASS GRAFT  2009   "CABG X4" (03/07/2013)   MEDIAL PARTIAL KNEE REPLACEMENT Right 03/07/2013   MEDIAL PARTIAL KNEE REPLACEMENT Right 03/07/2013   Procedure: MEDIAL PARTIAL KNEE REPLACEMENT;  Surgeon: Dannielle Huh, MD;  Location: [facility] OR;  Service: Orthopedics;  Laterality: Right;   TONSILLECTOMY AND ADENOIDECTOMY  ~ 1968   VAGINAL HYSTERECTOMY      Current Medications: Current Meds  Medication Sig   albuterol (PROVENTIL) (2.5 MG/3ML) 0.083% nebulizer solution Take 2.5 mg by nebulization every 6 (six) hours as needed for wheezing or shortness of breath.   apixaban (ELIQUIS) 5 MG TABS tablet Take 5 mg by mouth 2 (two) times daily.   BIOTIN 5000 PO Take 1  tablet by mouth daily.   cholecalciferol (VITAMIN D) 1000 UNITS tablet Take 1,000 Units by mouth daily.   Cyanocobalamin (VITAMIN B 12 PO) Take 2,000 mg by mouth daily.   Dextromethorphan-guaiFENesin (MUCINEX FAST-MAX DM MAX PO) Take 1 capsule by mouth as needed (congestion).   diltiazem (CARDIZEM LA) 240 MG 24 hr tablet Take 1 tablet (240 mg total) by mouth daily.   furosemide (LASIX) 40 MG tablet Take 40 mg by mouth daily.   ipratropium (ATROVENT) 0.02 % nebulizer solution Take 0.2 mg by nebulization 4 (four) times daily.   lisinopril (ZESTRIL) 40 MG tablet Take 40 mg by mouth in the morning and at bedtime.   Melatonin 10 MG TABS Take 1 tablet by mouth at bedtime.   metFORMIN (GLUCOPHAGE) 500 MG tablet Take 500 mg by mouth 2 (two) times daily with a meal.   Misc Natural Products (NEURIVA PO) Take 1 tablet by mouth daily. Unknown strenght   nitroGLYCERIN (NITROSTAT) 0.4 MG SL tablet Place 0.4 mg under the tongue every 5 (five) minutes as needed for chest pain.   omeprazole (PRILOSEC) 20 MG capsule Take 1 capsule by mouth daily.   ranolazine (RANEXA) 500 MG 12 hr tablet TAKE ONE TABLET BY MOUTH TWICE DAILY (Patient taking differently: Take 500 mg by mouth 2 (two) times daily.)     Allergies:   Acetaminophen, Codeine, Darvocet [propoxyphene n-acetaminophen], Percocet [oxycodone-acetaminophen], Propoxyphene, and Tramadol   Social History   Socioeconomic History   Marital status: Married    Spouse name: Not on file   Number of children: Not on file   Years of education: Not on file   Highest education level: Not on file  Occupational History   Not on file  Tobacco Use   Smoking status: Never   Smokeless tobacco: Never  Substance and Sexual Activity   Alcohol use: No   Drug use: No   Sexual activity: Not Currently  Other Topics Concern   Not on file  Social History Narrative   Not on file   Social Determinants of Health   Financial Resource Strain: Not on file  Food Insecurity:  Not on file  Transportation Needs: Not on file  Physical Activity: Not on file  Stress: Not on file  Social Connections: Not on file     Family History: The patient's family history is not on file. ROS:   Please see the history of present illness.    All 14 point review of systems negative except as described per history of present illness  EKGs/Labs/Other Studies Reviewed:         Recent Labs: No results found for requested labs within last 365 days.  Recent Lipid Panel    Component Value Date/Time   CHOL 213 (H) 09/03/2020 1309  TRIG 102 09/03/2020 1309   HDL 55 09/03/2020 1309   CHOLHDL 3.9 09/03/2020 1309   LDLCALC 140 (H) 09/03/2020 1309   LDLDIRECT 119 (H) 08/13/2021 1555    Physical Exam:    VS:  BP (!) 134/56 (BP Location: Left Arm, Patient Position: Sitting)   Pulse 79   Ht 5\' 2"  (1.575 m)   Wt 185 lb (83.9 kg)   SpO2 98%   BMI 33.84 kg/m     Wt Readings from Last 3 Encounters:  03/03/23 185 lb (83.9 kg)  09/02/22 187 lb 3.2 oz (84.9 kg)  05/29/22 191 lb (86.6 kg)     GEN:  Well nourished, well developed in no acute distress HEENT: Normal NECK: No JVD; No carotid bruits LYMPHATICS: No lymphadenopathy CARDIAC: RRR, no murmurs, no rubs, no gallops RESPIRATORY:  Clear to auscultation without rales, wheezing or rhonchi  ABDOMEN: Soft, non-tender, non-distended MUSCULOSKELETAL:  No edema; No deformity  SKIN: Warm and dry LOWER EXTREMITIES: no swelling NEUROLOGIC:  Alert and oriented x 3 PSYCHIATRIC:  Normal affect   ASSESSMENT:    1. Coronary artery disease involving native coronary artery of native heart with angina pectoris (HCC)   2. Chronic diastolic (congestive) heart failure (HCC)   3. Mild intermittent asthma, unspecified whether complicated   4. Dyslipidemia    PLAN:    In order of problems listed above:  Coronary disease stable from that point review on appropriate medications. Paroxysmal atrial fibrillation.  Denies having any.   Anticoagulated which I will continue. Chronic diastolic congestive heart failure physical exam showing compensated we will continue present management. Intermittent asthma that need follow-up by internal medicine team. Dyslipidemia again I brought an issue of potentially taking some medication she does not want to I explained to her the significance of this approach she understand but still does not want to take any medications   Medication Adjustments/Labs and Tests Ordered: Current medicines are reviewed at length with the patient today.  Concerns regarding medicines are outlined above.  No orders of the defined types were placed in this encounter.  Medication changes: No orders of the defined types were placed in this encounter.   Signed, Georgeanna Lea, MD, [facility] 03/03/2023 1:42 PM    [facility]

## 2023-03-17 ENCOUNTER — Other Ambulatory Visit: Payer: Self-pay | Admitting: Cardiology

## 2023-05-16 ENCOUNTER — Other Ambulatory Visit: Payer: Self-pay | Admitting: Cardiology

## 2023-08-03 ENCOUNTER — Other Ambulatory Visit: Payer: Self-pay | Admitting: Oncology

## 2023-08-03 DIAGNOSIS — D508 Other iron deficiency anemias: Secondary | ICD-10-CM

## 2023-08-03 NOTE — Progress Notes (Signed)
 [facility]  [address] [place],  [place]  [address] [phone]  Clinic Day:  08/04/2023  Referring physician: Silver Lamar LABOR, MD   HISTORY OF PRESENT ILLNESS:  The patient is an 82 y.o. female  who I was asked to consult upon for iron  deficiency anemia.  Recent labs showed a low hemoglobin of 9.1, with a low MCV of 59.2.  Iron  studies done recently showed a low ferritin of 2, a low serum iron  of 14, a TIBC of 535, and a low iron  saturation of 3%.  The patient first recalls having anemia when she had heavy menstrual cycles during her early adult years.  This ultimately ended up leading to a hysterectomy in 1981.  Afterwards, her anemia issues resolved.  Since then, she has not been told about having anemia until recently.  She denies having any overt forms of blood loss to explain her anemia.  She denies being on any NSAIDs.  She denies having any previous stomach surgery.  The patient has had multiple colonoscopies in her lifetime, with her last one being approximately 10 years ago.  She recalls polyps being removed at that time.  No other adverse GI tract pathology was appreciated.  The patient has tried oral iron  in the past, but it caused severe constipation.  To her knowledge, there is no family history of anemia or other hematologic disorders.  PAST MEDICAL HISTORY:   Past Medical History:  Diagnosis Date   Abnormal nuclear stress test 09/23/2018   Acute ischemic stroke (HCC) 10/04/2018   Anxiety 01/26/2017   Arthritis    right knee  (03/07/2013)   Atopic dermatitis 06/03/2018   Atrial fibrillation by electrocardiography (HCC) 08/17/2019   Back pain, lumbosacral 01/26/2017   Bradycardia 08/17/2019   Breast pain, left 01/26/2017   Formatting of this note might be different from the original. diagnostic mammogram   Candidiasis of mouth and esophagus (HCC) 01/26/2017   Overview:  magic mouth wash tid  Formatting of this note might be different from  the original. magic mouth wash tid   Carpal tunnel syndrome 01/26/2017   Overview:  declines further eval at this time  Formatting of this note might be different from the original. 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 declines further eval at this time  Formatting of this note might be different from the original. 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 Follow up if symptoms do not progressively improve or worsen.  Formatting of this note might be different from the original. Decongestant and valsalva exercises. Follow up if symptoms do not progressively improve or worsen.   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 diagnostic mammogram at [facility]   Medicare annual wellness visit, subsequent 04/27/2019   Mild intermittent asthma 07/15/2018   Nocturnal hypoxemia 01/26/2017   Formatting of this note might be different from the original. 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 decrease metformin  to 1 daily   Type II diabetes mellitus (HCC)    Unspecified osteoarthritis, unspecified site 01/26/2017   Visual changes 03/26/2021   Vitamin D deficiency 01/26/2017   Wheezing 01/26/2017   PAST SURGICAL HISTORY:   Past Surgical History:  Procedure Laterality Date   AXILLARY LYMPH NODE DISSECTION Right  Cataract  surgery Right 06/2020   Will have L eye done on 08/14/2020   CHOLECYSTECTOMY     CORONARY ANGIOPLASTY     CORONARY ARTERY BYPASS GRAFT  2009   CABG X4 (03/07/2013)   MEDIAL PARTIAL KNEE REPLACEMENT Right 03/07/2013   MEDIAL PARTIAL KNEE REPLACEMENT Right 03/07/2013   Procedure: MEDIAL PARTIAL KNEE REPLACEMENT;  Surgeon: Marcey Raman, MD;  Location: [facility] OR;  Service: Orthopedics;  Laterality: Right;   TONSILLECTOMY AND ADENOIDECTOMY  ~ 1968   VAGINAL HYSTERECTOMY      CURRENT MEDICATIONS:   Current Outpatient Medications  Medication Sig Dispense Refill   albuterol  (PROVENTIL ) (2.5 MG/3ML) 0.083% nebulizer solution Take 2.5 mg by nebulization every 6 (six) hours as needed for wheezing or shortness of  breath.     apixaban  (ELIQUIS ) 5 MG TABS tablet Take 5 mg by mouth 2 (two) times daily.     BIOTIN 5000 PO Take 1 tablet by mouth daily.     cholecalciferol (VITAMIN D) 1000 UNITS tablet Take 1,000 Units by mouth daily.     Cyanocobalamin (VITAMIN B 12 PO) Take 2,000 mg by mouth daily.     Dextromethorphan-guaiFENesin (MUCINEX FAST-MAX DM MAX PO) Take 1 capsule by mouth as needed (congestion).     diltiazem  (CARDIZEM  CD) 240 MG 24 hr capsule Take 1 capsule (240 mg total) by mouth daily. 90 capsule 3   furosemide (LASIX) 40 MG tablet Take 40 mg by mouth daily.     ipratropium (ATROVENT) 0.02 % nebulizer solution Take 0.2 mg by nebulization 4 (four) times daily.     lisinopril  (ZESTRIL ) 40 MG tablet Take 40 mg by mouth in the morning and at bedtime.     Melatonin 10 MG TABS Take 1 tablet by mouth at bedtime.     metFORMIN  (GLUCOPHAGE ) 500 MG tablet Take 500 mg by mouth 2 (two) times daily with a meal.     Misc Natural Products (NEURIVA PO) Take 1 tablet by mouth daily. Unknown strenght     nitroGLYCERIN (NITROSTAT) 0.4 MG SL tablet Place 0.4 mg under the tongue every 5 (five) minutes as needed for chest pain.     omeprazole (PRILOSEC) 20 MG capsule Take 1 capsule by mouth daily.     ranolazine  (RANEXA ) 500 MG 12 hr tablet TAKE ONE TABLET BY MOUTH TWICE DAILY 180 tablet 2   No current facility-administered medications for this visit.    ALLERGIES:   Allergies  Allergen Reactions   Acetaminophen  Other (See Comments)   Codeine Other (See Comments) and Nausea Only    Lightheaded, nausea, breaks out in a sweat   Darvocet [Propoxyphene N-Acetaminophen ] Other (See Comments)    Lightheaded, nausea, breaks out in a sweat   Percocet [Oxycodone-Acetaminophen ] Other (See Comments)    Lightheaded, nausea, breaks out in a sweat   Propoxyphene Other (See Comments)   Tramadol Other (See Comments)    Lightheaded, nausea, breaks out in a sweat    FAMILY HISTORY:   Family History  Problem Relation  Age of Onset   Arthritis Mother    Diabetes Mother    Heart disease Mother    Heart failure Mother    Heart disease Father    CVA Father    Heart disease Sister    Arthritis/Rheumatoid Sister    Breast cancer Sister    Breast cancer Sister    Heart disease Brother    Breast cancer Paternal Aunt    Breast cancer Paternal Aunt    Lymphoma Child  SOCIAL HISTORY:  The patient was born and raised in [place].  She currently lives there with her husband of 61 years.  They have 2 children, 2 grandchildren and 6 great-grandchildren.  One of the children died from metastatic cancer.  She works at a actor and a psychiatrist for 46 years.  There is no history of alcoholism or tobacco abuse.  REVIEW OF SYSTEMS:  Review of Systems  Constitutional:  Negative for fatigue and fever.  HENT:   Positive for hearing loss. Negative for sore throat.   Eyes:  Negative for eye problems.  Respiratory:  Positive for shortness of breath. Negative for chest tightness, cough and hemoptysis.   Cardiovascular:  Negative for chest pain and palpitations.  Gastrointestinal:  Positive for constipation. Negative for abdominal distention, abdominal pain, blood in stool, diarrhea, nausea and vomiting.  Endocrine: Negative for hot flashes.  Genitourinary:  Negative for difficulty urinating, dysuria, frequency, hematuria and nocturia.   Musculoskeletal:  Negative for arthralgias, back pain, gait problem and myalgias.  Skin: Negative.  Negative for itching and rash.  Neurological: Negative.  Negative for dizziness, extremity weakness, gait problem, headaches, light-headedness and numbness.  Hematological: Negative.   Psychiatric/Behavioral: Negative.  Negative for depression and suicidal ideas. The patient is not nervous/anxious.     PHYSICAL EXAM:  Blood pressure (!) 156/62, pulse 77, temperature 98.6 F (37 C), temperature source Oral, resp. rate 18, height 5' 2 (1.575 m), weight 176 lb 12.8 oz  (80.2 kg), SpO2 97%. Wt Readings from Last 3 Encounters:  08/04/23 176 lb 12.8 oz (80.2 kg)  03/03/23 185 lb (83.9 kg)  09/02/22 187 lb 3.2 oz (84.9 kg)   Body mass index is 32.34 kg/m. Performance status (ECOG): 1 - Symptomatic but completely ambulatory Physical Exam Constitutional:      Appearance: Normal appearance. She is not ill-appearing.  HENT:     Mouth/Throat:     Mouth: Mucous membranes are moist.     Pharynx: Oropharynx is clear. No oropharyngeal exudate or posterior oropharyngeal erythema.  Cardiovascular:     Rate and Rhythm: Normal rate and regular rhythm.     Heart sounds: No murmur heard.    No friction rub. No gallop.  Pulmonary:     Effort: Pulmonary effort is normal. No respiratory distress.     Breath sounds: Normal breath sounds. No wheezing, rhonchi or rales.  Abdominal:     General: Bowel sounds are normal. There is no distension.     Palpations: Abdomen is soft. There is no mass.     Tenderness: There is no abdominal tenderness.  Musculoskeletal:        General: No swelling.     Right lower leg: No edema.     Left lower leg: No edema.  Lymphadenopathy:     Cervical: No cervical adenopathy.     Upper Body:     Right upper body: No supraclavicular or axillary adenopathy.     Left upper body: No supraclavicular or axillary adenopathy.     Lower Body: No right inguinal adenopathy. No left inguinal adenopathy.  Skin:    General: Skin is warm.     Coloration: Skin is not jaundiced.     Findings: No lesion or rash.  Neurological:     General: No focal deficit present.     Mental Status: She is alert and oriented to person, place, and time. Mental status is at baseline.  Psychiatric:        Mood and  Affect: Mood normal.        Behavior: Behavior normal.        Thought Content: Thought content normal.   LABS:      Latest Ref Rng & Units 08/04/2023    1:46 PM 03/09/2013    5:00 AM 03/08/2013    5:00 AM  CBC  WBC 4.0 - 10.5 K/uL 7.3  9.3  12.2    Hemoglobin 12.0 - 15.0 g/dL 8.9  88.5  87.8   Hematocrit 36.0 - 46.0 % 29.9  34.9  37.1   Platelets 150 - 400 K/uL 310  192  201       Latest Ref Rng & Units 08/13/2021    3:55 PM 03/08/2013    5:00 AM 03/07/2013    3:35 PM  CMP  Glucose 70 - 99 mg/dL 889  860    BUN 8 - 27 mg/dL 12  10    Creatinine 9.42 - 1.00 mg/dL 9.30  9.47  9.45   Sodium 134 - 144 mmol/L 137  133    Potassium 3.5 - 5.2 mmol/L 4.2  3.4    Chloride 96 - 106 mmol/L 99  96    CO2 20 - 29 mmol/L 24  28    Calcium  8.7 - 10.3 mg/dL 9.8  9.2    Total Protein 6.0 - 8.5 g/dL 7.1     Total Bilirubin 0.0 - 1.2 mg/dL <9.7     Alkaline Phos 44 - 121 IU/L 97     AST 0 - 40 IU/L 16     ALT 0 - 32 IU/L 12      ASSESSMENT & PLAN:  A 82 y.o. female whose labs clearly show iron  deficiency anemia.  I will arrange for her to receive IV iron  over these next few weeks to rapidly replenish her iron  stores and normalize her hemoglobin.  Although she is 82 years old, she clinically appears to be fairly healthy.  Based upon this, I do believe she warrants a GI evaluation to ensure her iron  deficiency anemia is not due to an occult GI tract process.  Otherwise, I will see her back in 3 months to reassess her iron  and hemoglobin levels to see how well she responded to her upcoming IV iron .  The patient understands all the plans discussed today and is in agreement with them.  I do appreciate Silver Lamar LABOR, MD for his new consult.   Allyson Tineo LABOR [place], MD

## 2023-08-04 ENCOUNTER — Inpatient Hospital Stay: Payer: Medicare Other

## 2023-08-04 ENCOUNTER — Inpatient Hospital Stay: Payer: Medicare Other | Attending: Oncology | Admitting: Oncology

## 2023-08-04 ENCOUNTER — Encounter: Payer: Self-pay | Admitting: Oncology

## 2023-08-04 ENCOUNTER — Other Ambulatory Visit: Payer: Self-pay | Admitting: Oncology

## 2023-08-04 VITALS — BP 156/62 | HR 77 | Temp 98.6°F | Resp 18 | Ht 62.0 in | Wt 176.8 lb

## 2023-08-04 DIAGNOSIS — D509 Iron deficiency anemia, unspecified: Secondary | ICD-10-CM | POA: Insufficient documentation

## 2023-08-04 DIAGNOSIS — D508 Other iron deficiency anemias: Secondary | ICD-10-CM | POA: Diagnosis not present

## 2023-08-04 LAB — CBC WITH DIFFERENTIAL (CANCER CENTER ONLY)
Abs Immature Granulocytes: 0 10*3/uL (ref 0.00–0.07)
Basophils Absolute: 0 10*3/uL (ref 0.0–0.1)
Basophils Relative: 0 %
Eosinophils Absolute: 0 10*3/uL (ref 0.0–0.5)
Eosinophils Relative: 0 %
HCT: 29.9 % — ABNORMAL LOW (ref 36.0–46.0)
Hemoglobin: 8.9 g/dL — ABNORMAL LOW (ref 12.0–15.0)
Immature Granulocytes: 0 %
Lymphocytes Relative: 12 %
Lymphs Abs: 0.9 10*3/uL (ref 0.7–4.0)
MCH: 19.4 pg — ABNORMAL LOW (ref 26.0–34.0)
MCHC: 29.8 g/dL — ABNORMAL LOW (ref 30.0–36.0)
MCV: 65.3 fL — ABNORMAL LOW (ref 80.0–100.0)
Monocytes Absolute: 0.3 10*3/uL (ref 0.1–1.0)
Monocytes Relative: 4 %
Neutro Abs: 6.1 10*3/uL (ref 1.7–7.7)
Neutrophils Relative %: 84 %
Platelet Count: 310 10*3/uL (ref 150–400)
RBC: 4.58 MIL/uL (ref 3.87–5.11)
RDW: 21.9 % — ABNORMAL HIGH (ref 11.5–15.5)
Smear Review: NORMAL
WBC Count: 7.3 10*3/uL (ref 4.0–10.5)
nRBC: 0 % (ref 0.0–0.2)
nRBC: 0 /100{WBCs}

## 2023-08-06 ENCOUNTER — Encounter: Payer: Self-pay | Admitting: Oncology

## 2023-08-07 ENCOUNTER — Encounter: Payer: Self-pay | Admitting: Oncology

## 2023-08-10 ENCOUNTER — Inpatient Hospital Stay: Payer: Medicare Other | Attending: Oncology

## 2023-08-10 VITALS — BP 151/49 | HR 58 | Temp 98.4°F | Resp 16 | Wt 180.0 lb

## 2023-08-10 DIAGNOSIS — Z79899 Other long term (current) drug therapy: Secondary | ICD-10-CM | POA: Diagnosis not present

## 2023-08-10 DIAGNOSIS — D509 Iron deficiency anemia, unspecified: Secondary | ICD-10-CM | POA: Diagnosis present

## 2023-08-10 DIAGNOSIS — D508 Other iron deficiency anemias: Secondary | ICD-10-CM

## 2023-08-10 MED ORDER — LORATADINE 10 MG PO TABS
10.0000 mg | ORAL_TABLET | Freq: Once | ORAL | Status: AC
  Administered 2023-08-10: 10 mg via ORAL
  Filled 2023-08-10: qty 1

## 2023-08-10 MED ORDER — FAMOTIDINE IN NACL 20-0.9 MG/50ML-% IV SOLN
20.0000 mg | Freq: Once | INTRAVENOUS | Status: AC
  Administered 2023-08-10: 20 mg via INTRAVENOUS
  Filled 2023-08-10: qty 50

## 2023-08-10 MED ORDER — IRON SUCROSE 20 MG/ML IV SOLN
200.0000 mg | Freq: Once | INTRAVENOUS | Status: AC
  Administered 2023-08-10: 200 mg via INTRAVENOUS
  Filled 2023-08-10: qty 10

## 2023-08-10 MED ORDER — SODIUM CHLORIDE 0.9 % IV SOLN: INTRAVENOUS | Status: DC

## 2023-08-10 NOTE — Patient Instructions (Signed)
 Iron Sucrose Injection What is this medication? IRON SUCROSE (EYE ern SOO krose) treats low levels of iron (iron deficiency anemia) in people with kidney disease. Iron is a mineral that plays an important role in making red blood cells, which carry oxygen from your lungs to the rest of your body. This medicine may be used for other purposes; ask your health care provider or pharmacist if you have questions. COMMON BRAND NAME(S): Venofer What should I tell my care team before I take this medication? They need to know if you have any of these conditions: Anemia not caused by low iron levels Heart disease High levels of iron in the blood Kidney disease Liver disease An unusual or allergic reaction to iron, other medications, foods, dyes, or preservatives Pregnant or trying to get pregnant Breastfeeding How should I use this medication? This medication is for infusion into a vein. It is given in a hospital or clinic setting. Talk to your care team about the use of this medication in children. While this medication may be prescribed for children as young as 2 years for selected conditions, precautions do apply. Overdosage: If you think you have taken too much of this medicine contact a poison control center or emergency room at once. NOTE: This medicine is only for you. Do not share this medicine with others. What if I miss a dose? Keep appointments for follow-up doses. It is important not to miss your dose. Call your care team if you are unable to keep an appointment. What may interact with this medication? Do not take this medication with any of the following: Deferoxamine Dimercaprol Other iron products This medication may also interact with the following: Chloramphenicol Deferasirox This list may not describe all possible interactions. Give your health care provider a list of all the medicines, herbs, non-prescription drugs, or dietary supplements you use. Also tell them if you smoke,  drink alcohol, or use illegal drugs. Some items may interact with your medicine. What should I watch for while using this medication? Visit your care team regularly. Tell your care team if your symptoms do not start to get better or if they get worse. You may need blood work done while you are taking this medication. You may need to follow a special diet. Talk to your care team. Foods that contain iron include: whole grains/cereals, dried fruits, beans, or peas, leafy green vegetables, and organ meats (liver, kidney). What side effects may I notice from receiving this medication? Side effects that you should report to your care team as soon as possible: Allergic reactions--skin rash, itching, hives, swelling of the face, lips, tongue, or throat Low blood pressure--dizziness, feeling faint or lightheaded, blurry vision Shortness of breath Side effects that usually do not require medical attention (report to your care team if they continue or are bothersome): Flushing Headache Joint pain Muscle pain Nausea Pain, redness, or irritation at injection site This list may not describe all possible side effects. Call your doctor for medical advice about side effects. You may report side effects to FDA at 1-800-FDA-1088. Where should I keep my medication? This medication is given in a hospital or clinic. It will not be stored at home. NOTE: This sheet is a summary. It may not cover all possible information. If you have questions about this medicine, talk to your doctor, pharmacist, or health care provider.  2024 Elsevier/Gold Standard (2022-12-26 00:00:00)

## 2023-08-11 ENCOUNTER — Inpatient Hospital Stay: Payer: Medicare Other

## 2023-08-11 VITALS — BP 160/69 | HR 69 | Temp 98.4°F | Resp 18

## 2023-08-11 DIAGNOSIS — D508 Other iron deficiency anemias: Secondary | ICD-10-CM

## 2023-08-11 DIAGNOSIS — D509 Iron deficiency anemia, unspecified: Secondary | ICD-10-CM | POA: Diagnosis not present

## 2023-08-11 MED ORDER — SODIUM CHLORIDE 0.9 % IV SOLN: INTRAVENOUS | Status: DC

## 2023-08-11 MED ORDER — LORATADINE 10 MG PO TABS
10.0000 mg | ORAL_TABLET | Freq: Once | ORAL | Status: AC
Start: 2023-08-11 — End: 2023-08-11
  Administered 2023-08-11: 10 mg via ORAL
  Filled 2023-08-11: qty 1

## 2023-08-11 MED ORDER — IRON SUCROSE 20 MG/ML IV SOLN
200.0000 mg | Freq: Once | INTRAVENOUS | Status: AC
Start: 2023-08-11 — End: 2023-08-11
  Administered 2023-08-11: 200 mg via INTRAVENOUS
  Filled 2023-08-11: qty 10

## 2023-08-11 MED ORDER — FAMOTIDINE IN NACL 20-0.9 MG/50ML-% IV SOLN
20.0000 mg | Freq: Once | INTRAVENOUS | Status: AC
Start: 2023-08-11 — End: 2023-08-11
  Administered 2023-08-11: 20 mg via INTRAVENOUS
  Filled 2023-08-11: qty 50

## 2023-08-11 NOTE — Patient Instructions (Signed)
 Iron Sucrose Injection What is this medication? IRON SUCROSE (EYE ern SOO krose) treats low levels of iron (iron deficiency anemia) in people with kidney disease. Iron is a mineral that plays an important role in making red blood cells, which carry oxygen from your lungs to the rest of your body. This medicine may be used for other purposes; ask your health care provider or pharmacist if you have questions. COMMON BRAND NAME(S): Venofer What should I tell my care team before I take this medication? They need to know if you have any of these conditions: Anemia not caused by low iron levels Heart disease High levels of iron in the blood Kidney disease Liver disease An unusual or allergic reaction to iron, other medications, foods, dyes, or preservatives Pregnant or trying to get pregnant Breastfeeding How should I use this medication? This medication is for infusion into a vein. It is given in a hospital or clinic setting. Talk to your care team about the use of this medication in children. While this medication may be prescribed for children as young as 2 years for selected conditions, precautions do apply. Overdosage: If you think you have taken too much of this medicine contact a poison control center or emergency room at once. NOTE: This medicine is only for you. Do not share this medicine with others. What if I miss a dose? Keep appointments for follow-up doses. It is important not to miss your dose. Call your care team if you are unable to keep an appointment. What may interact with this medication? Do not take this medication with any of the following: Deferoxamine Dimercaprol Other iron products This medication may also interact with the following: Chloramphenicol Deferasirox This list may not describe all possible interactions. Give your health care provider a list of all the medicines, herbs, non-prescription drugs, or dietary supplements you use. Also tell them if you smoke,  drink alcohol, or use illegal drugs. Some items may interact with your medicine. What should I watch for while using this medication? Visit your care team regularly. Tell your care team if your symptoms do not start to get better or if they get worse. You may need blood work done while you are taking this medication. You may need to follow a special diet. Talk to your care team. Foods that contain iron include: whole grains/cereals, dried fruits, beans, or peas, leafy green vegetables, and organ meats (liver, kidney). What side effects may I notice from receiving this medication? Side effects that you should report to your care team as soon as possible: Allergic reactions--skin rash, itching, hives, swelling of the face, lips, tongue, or throat Low blood pressure--dizziness, feeling faint or lightheaded, blurry vision Shortness of breath Side effects that usually do not require medical attention (report to your care team if they continue or are bothersome): Flushing Headache Joint pain Muscle pain Nausea Pain, redness, or irritation at injection site This list may not describe all possible side effects. Call your doctor for medical advice about side effects. You may report side effects to FDA at 1-800-FDA-1088. Where should I keep my medication? This medication is given in a hospital or clinic. It will not be stored at home. NOTE: This sheet is a summary. It may not cover all possible information. If you have questions about this medicine, talk to your doctor, pharmacist, or health care provider.  2024 Elsevier/Gold Standard (2022-12-26 00:00:00)

## 2023-08-13 ENCOUNTER — Inpatient Hospital Stay: Payer: Medicare Other

## 2023-08-13 VITALS — BP 141/63 | HR 65 | Temp 97.6°F | Resp 16

## 2023-08-13 DIAGNOSIS — D508 Other iron deficiency anemias: Secondary | ICD-10-CM

## 2023-08-13 DIAGNOSIS — D509 Iron deficiency anemia, unspecified: Secondary | ICD-10-CM | POA: Diagnosis not present

## 2023-08-13 MED ORDER — IRON SUCROSE 20 MG/ML IV SOLN
200.0000 mg | Freq: Once | INTRAVENOUS | Status: AC
Start: 2023-08-13 — End: 2023-08-13
  Administered 2023-08-13: 200 mg via INTRAVENOUS
  Filled 2023-08-13: qty 10

## 2023-08-13 MED ORDER — SODIUM CHLORIDE 0.9 % IV SOLN: INTRAVENOUS | Status: DC

## 2023-08-13 MED ORDER — FAMOTIDINE IN NACL 20-0.9 MG/50ML-% IV SOLN
20.0000 mg | Freq: Once | INTRAVENOUS | Status: AC
Start: 2023-08-13 — End: 2023-08-13
  Administered 2023-08-13: 20 mg via INTRAVENOUS
  Filled 2023-08-13: qty 50

## 2023-08-13 MED ORDER — LORATADINE 10 MG PO TABS
10.0000 mg | ORAL_TABLET | Freq: Once | ORAL | Status: AC
Start: 2023-08-13 — End: 2023-08-13
  Administered 2023-08-13: 10 mg via ORAL
  Filled 2023-08-13: qty 1

## 2023-08-13 NOTE — Patient Instructions (Signed)
 Iron Sucrose Injection What is this medication? IRON SUCROSE (EYE ern SOO krose) treats low levels of iron (iron deficiency anemia) in people with kidney disease. Iron is a mineral that plays an important role in making red blood cells, which carry oxygen from your lungs to the rest of your body. This medicine may be used for other purposes; ask your health care provider or pharmacist if you have questions. COMMON BRAND NAME(S): Venofer What should I tell my care team before I take this medication? They need to know if you have any of these conditions: Anemia not caused by low iron levels Heart disease High levels of iron in the blood Kidney disease Liver disease An unusual or allergic reaction to iron, other medications, foods, dyes, or preservatives Pregnant or trying to get pregnant Breastfeeding How should I use this medication? This medication is for infusion into a vein. It is given in a hospital or clinic setting. Talk to your care team about the use of this medication in children. While this medication may be prescribed for children as young as 2 years for selected conditions, precautions do apply. Overdosage: If you think you have taken too much of this medicine contact a poison control center or emergency room at once. NOTE: This medicine is only for you. Do not share this medicine with others. What if I miss a dose? Keep appointments for follow-up doses. It is important not to miss your dose. Call your care team if you are unable to keep an appointment. What may interact with this medication? Do not take this medication with any of the following: Deferoxamine Dimercaprol Other iron products This medication may also interact with the following: Chloramphenicol Deferasirox This list may not describe all possible interactions. Give your health care provider a list of all the medicines, herbs, non-prescription drugs, or dietary supplements you use. Also tell them if you smoke,  drink alcohol, or use illegal drugs. Some items may interact with your medicine. What should I watch for while using this medication? Visit your care team regularly. Tell your care team if your symptoms do not start to get better or if they get worse. You may need blood work done while you are taking this medication. You may need to follow a special diet. Talk to your care team. Foods that contain iron include: whole grains/cereals, dried fruits, beans, or peas, leafy green vegetables, and organ meats (liver, kidney). What side effects may I notice from receiving this medication? Side effects that you should report to your care team as soon as possible: Allergic reactions--skin rash, itching, hives, swelling of the face, lips, tongue, or throat Low blood pressure--dizziness, feeling faint or lightheaded, blurry vision Shortness of breath Side effects that usually do not require medical attention (report to your care team if they continue or are bothersome): Flushing Headache Joint pain Muscle pain Nausea Pain, redness, or irritation at injection site This list may not describe all possible side effects. Call your doctor for medical advice about side effects. You may report side effects to FDA at 1-800-FDA-1088. Where should I keep my medication? This medication is given in a hospital or clinic. It will not be stored at home. NOTE: This sheet is a summary. It may not cover all possible information. If you have questions about this medicine, talk to your doctor, pharmacist, or health care provider.  2024 Elsevier/Gold Standard (2022-12-26 00:00:00)

## 2023-08-18 ENCOUNTER — Inpatient Hospital Stay: Payer: Medicare Other

## 2023-08-18 VITALS — BP 114/69 | HR 71 | Temp 98.3°F | Resp 16

## 2023-08-18 DIAGNOSIS — D509 Iron deficiency anemia, unspecified: Secondary | ICD-10-CM | POA: Diagnosis not present

## 2023-08-18 DIAGNOSIS — D508 Other iron deficiency anemias: Secondary | ICD-10-CM

## 2023-08-18 MED ORDER — LORATADINE 10 MG PO TABS
10.0000 mg | ORAL_TABLET | Freq: Once | ORAL | Status: AC
Start: 2023-08-18 — End: 2023-08-18
  Administered 2023-08-18: 10 mg via ORAL
  Filled 2023-08-18: qty 1

## 2023-08-18 MED ORDER — SODIUM CHLORIDE 0.9 % IV SOLN
INTRAVENOUS | Status: DC
Start: 2023-08-18 — End: 2023-08-18

## 2023-08-18 MED ORDER — IRON SUCROSE 20 MG/ML IV SOLN
200.0000 mg | Freq: Once | INTRAVENOUS | Status: AC
  Administered 2023-08-18: 200 mg via INTRAVENOUS
  Filled 2023-08-18: qty 10

## 2023-08-18 MED ORDER — FAMOTIDINE IN NACL 20-0.9 MG/50ML-% IV SOLN
20.0000 mg | Freq: Once | INTRAVENOUS | Status: AC
Start: 2023-08-18 — End: 2023-08-18
  Administered 2023-08-18: 20 mg via INTRAVENOUS
  Filled 2023-08-18: qty 50

## 2023-08-18 NOTE — Patient Instructions (Signed)
 Iron Sucrose Injection What is this medication? IRON SUCROSE (EYE ern SOO krose) treats low levels of iron (iron deficiency anemia) in people with kidney disease. Iron is a mineral that plays an important role in making red blood cells, which carry oxygen from your lungs to the rest of your body. This medicine may be used for other purposes; ask your health care provider or pharmacist if you have questions. COMMON BRAND NAME(S): Venofer What should I tell my care team before I take this medication? They need to know if you have any of these conditions: Anemia not caused by low iron levels Heart disease High levels of iron in the blood Kidney disease Liver disease An unusual or allergic reaction to iron, other medications, foods, dyes, or preservatives Pregnant or trying to get pregnant Breastfeeding How should I use this medication? This medication is for infusion into a vein. It is given in a hospital or clinic setting. Talk to your care team about the use of this medication in children. While this medication may be prescribed for children as young as 2 years for selected conditions, precautions do apply. Overdosage: If you think you have taken too much of this medicine contact a poison control center or emergency room at once. NOTE: This medicine is only for you. Do not share this medicine with others. What if I miss a dose? Keep appointments for follow-up doses. It is important not to miss your dose. Call your care team if you are unable to keep an appointment. What may interact with this medication? Do not take this medication with any of the following: Deferoxamine Dimercaprol Other iron products This medication may also interact with the following: Chloramphenicol Deferasirox This list may not describe all possible interactions. Give your health care provider a list of all the medicines, herbs, non-prescription drugs, or dietary supplements you use. Also tell them if you smoke,  drink alcohol, or use illegal drugs. Some items may interact with your medicine. What should I watch for while using this medication? Visit your care team regularly. Tell your care team if your symptoms do not start to get better or if they get worse. You may need blood work done while you are taking this medication. You may need to follow a special diet. Talk to your care team. Foods that contain iron include: whole grains/cereals, dried fruits, beans, or peas, leafy green vegetables, and organ meats (liver, kidney). What side effects may I notice from receiving this medication? Side effects that you should report to your care team as soon as possible: Allergic reactions--skin rash, itching, hives, swelling of the face, lips, tongue, or throat Low blood pressure--dizziness, feeling faint or lightheaded, blurry vision Shortness of breath Side effects that usually do not require medical attention (report to your care team if they continue or are bothersome): Flushing Headache Joint pain Muscle pain Nausea Pain, redness, or irritation at injection site This list may not describe all possible side effects. Call your doctor for medical advice about side effects. You may report side effects to FDA at 1-800-FDA-1088. Where should I keep my medication? This medication is given in a hospital or clinic. It will not be stored at home. NOTE: This sheet is a summary. It may not cover all possible information. If you have questions about this medicine, talk to your doctor, pharmacist, or health care provider.  2024 Elsevier/Gold Standard (2022-12-26 00:00:00)

## 2023-08-20 ENCOUNTER — Inpatient Hospital Stay: Payer: Medicare Other

## 2023-08-20 VITALS — BP 117/75 | HR 66 | Temp 97.6°F | Resp 16

## 2023-08-20 DIAGNOSIS — D508 Other iron deficiency anemias: Secondary | ICD-10-CM

## 2023-08-20 DIAGNOSIS — D509 Iron deficiency anemia, unspecified: Secondary | ICD-10-CM | POA: Diagnosis not present

## 2023-08-20 MED ORDER — SODIUM CHLORIDE 0.9 % IV SOLN: INTRAVENOUS | Status: DC

## 2023-08-20 MED ORDER — LORATADINE 10 MG PO TABS
10.0000 mg | ORAL_TABLET | Freq: Once | ORAL | Status: AC
Start: 2023-08-20 — End: 2023-08-20
  Administered 2023-08-20: 10 mg via ORAL
  Filled 2023-08-20: qty 1

## 2023-08-20 MED ORDER — IRON SUCROSE 20 MG/ML IV SOLN
200.0000 mg | Freq: Once | INTRAVENOUS | Status: AC
  Administered 2023-08-20: 200 mg via INTRAVENOUS
  Filled 2023-08-20: qty 10

## 2023-08-20 MED ORDER — FAMOTIDINE IN NACL 20-0.9 MG/50ML-% IV SOLN
20.0000 mg | Freq: Once | INTRAVENOUS | Status: AC
  Administered 2023-08-20: 20 mg via INTRAVENOUS
  Filled 2023-08-20: qty 50

## 2023-08-20 NOTE — Patient Instructions (Signed)
Iron Sucrose Injection What is this medication? IRON SUCROSE (EYE ern SOO krose) treats low levels of iron (iron deficiency anemia) in people with kidney disease. Iron is a mineral that plays an important role in making red blood cells, which carry oxygen from your lungs to the rest of your body. This medicine may be used for other purposes; ask your health care provider or pharmacist if you have questions. COMMON BRAND NAME(S): Venofer What should I tell my care team before I take this medication? They need to know if you have any of these conditions: Anemia not caused by low iron levels Heart disease High levels of iron in the blood Kidney disease Liver disease An unusual or allergic reaction to iron, other medications, foods, dyes, or preservatives Pregnant or trying to get pregnant Breastfeeding How should I use this medication? This medication is for infusion into a vein. It is given in a hospital or clinic setting. Talk to your care team about the use of this medication in children. While this medication may be prescribed for children as young as 2 years for selected conditions, precautions do apply. Overdosage: If you think you have taken too much of this medicine contact a poison control center or emergency room at once. NOTE: This medicine is only for you. Do not share this medicine with others. What if I miss a dose? Keep appointments for follow-up doses. It is important not to miss your dose. Call your care team if you are unable to keep an appointment. What may interact with this medication? Do not take this medication with any of the following: Deferoxamine Dimercaprol Other iron products This medication may also interact with the following: Chloramphenicol Deferasirox This list may not describe all possible interactions. Give your health care provider a list of all the medicines, herbs, non-prescription drugs, or dietary supplements you use. Also tell them if you smoke,  drink alcohol, or use illegal drugs. Some items may interact with your medicine. What should I watch for while using this medication? Visit your care team regularly. Tell your care team if your symptoms do not start to get better or if they get worse. You may need blood work done while you are taking this medication. You may need to follow a special diet. Talk to your care team. Foods that contain iron include: whole grains/cereals, dried fruits, beans, or peas, leafy green vegetables, and organ meats (liver, kidney). What side effects may I notice from receiving this medication? Side effects that you should report to your care team as soon as possible: Allergic reactions--skin rash, itching, hives, swelling of the face, lips, tongue, or throat Low blood pressure--dizziness, feeling faint or lightheaded, blurry vision Shortness of breath Side effects that usually do not require medical attention (report to your care team if they continue or are bothersome): Flushing Headache Joint pain Muscle pain Nausea Pain, redness, or irritation at injection site This list may not describe all possible side effects. Call your doctor for medical advice about side effects. You may report side effects to FDA at 1-800-FDA-1088. Where should I keep my medication? This medication is given in a hospital or clinic. It will not be stored at home. NOTE: This sheet is a summary. It may not cover all possible information. If you have questions about this medicine, talk to your doctor, pharmacist, or health care provider.  2024 Elsevier/Gold Standard (2022-12-26 00:00:00)

## 2023-11-02 ENCOUNTER — Telehealth: Payer: Self-pay | Admitting: Oncology

## 2023-11-02 ENCOUNTER — Inpatient Hospital Stay: Payer: Medicare Other

## 2023-11-02 ENCOUNTER — Inpatient Hospital Stay: Payer: Medicare Other | Attending: Oncology | Admitting: Oncology

## 2023-11-02 ENCOUNTER — Other Ambulatory Visit: Payer: Self-pay | Admitting: Oncology

## 2023-11-02 VITALS — BP 155/80 | HR 66 | Temp 98.2°F | Resp 16 | Ht 62.0 in | Wt 176.0 lb

## 2023-11-02 DIAGNOSIS — D509 Iron deficiency anemia, unspecified: Secondary | ICD-10-CM | POA: Insufficient documentation

## 2023-11-02 DIAGNOSIS — D508 Other iron deficiency anemias: Secondary | ICD-10-CM

## 2023-11-02 LAB — CBC WITH DIFFERENTIAL (CANCER CENTER ONLY)
Abs Immature Granulocytes: 0.02 10*3/uL (ref 0.00–0.07)
Basophils Absolute: 0 10*3/uL (ref 0.0–0.1)
Basophils Relative: 0 %
Eosinophils Absolute: 0.1 10*3/uL (ref 0.0–0.5)
Eosinophils Relative: 1 %
HCT: 39.4 % (ref 36.0–46.0)
Hemoglobin: 13.1 g/dL (ref 12.0–15.0)
Immature Granulocytes: 0 %
Lymphocytes Relative: 28 %
Lymphs Abs: 2.3 10*3/uL (ref 0.7–4.0)
MCH: 27.9 pg (ref 26.0–34.0)
MCHC: 33.2 g/dL (ref 30.0–36.0)
MCV: 84 fL (ref 80.0–100.0)
Monocytes Absolute: 0.5 10*3/uL (ref 0.1–1.0)
Monocytes Relative: 7 %
Neutro Abs: 5.1 10*3/uL (ref 1.7–7.7)
Neutrophils Relative %: 64 %
Platelet Count: 210 10*3/uL (ref 150–400)
RBC: 4.69 MIL/uL (ref 3.87–5.11)
RDW: 21.1 % — ABNORMAL HIGH (ref 11.5–15.5)
WBC Count: 8 10*3/uL (ref 4.0–10.5)
nRBC: 0 % (ref 0.0–0.2)
nRBC: 0 /100{WBCs}

## 2023-11-02 LAB — IRON AND TIBC
Iron: 74 ug/dL (ref 28–170)
Saturation Ratios: 19 % (ref 10.4–31.8)
TIBC: 381 ug/dL (ref 250–450)
UIBC: 307 ug/dL

## 2023-11-02 LAB — FERRITIN: Ferritin: 35 ng/mL (ref 11–307)

## 2023-11-02 NOTE — Telephone Encounter (Signed)
 Patient has been scheduled for follow-up visit per 11/02/23 LOS.  Pt given an appt calendar with date and time.

## 2023-11-02 NOTE — Progress Notes (Signed)
 [facility]  [address] [place],  [place]  [address] [phone]  Clinic Day:  11/02/2023  Referring physician: Hadley Pen, MD   HISTORY OF PRESENT ILLNESS:  The patient is an 83 y.o. female with iron deficiency anemia.  She comes in today to reassess her iron and hemoglobin levels after receiving IV iron in January 2025.  Overall, she has definitely noticed an improvement in how she has felt since her IV iron was given.  She continues to deny having any overt forms of blood loss.   PHYSICAL EXAM:  Blood pressure (!) 155/80, pulse 66, temperature 98.2 F (36.8 C), temperature source Oral, resp. rate 16, height 5\' 2"  (1.575 m), weight 176 lb (79.8 kg), SpO2 94%. Wt Readings from Last 3 Encounters:  11/02/23 176 lb (79.8 kg)  08/10/23 180 lb (81.6 kg)  08/04/23 176 lb 12.8 oz (80.2 kg)   Body mass index is 32.19 kg/m. Performance status (ECOG): 1 - Symptomatic but completely ambulatory Physical Exam Constitutional:      Appearance: Normal appearance. She is not ill-appearing.  HENT:     Mouth/Throat:     Mouth: Mucous membranes are moist.     Pharynx: Oropharynx is clear. No oropharyngeal exudate or posterior oropharyngeal erythema.  Cardiovascular:     Rate and Rhythm: Normal rate and regular rhythm.     Heart sounds: No murmur heard.    No friction rub. No gallop.  Pulmonary:     Effort: Pulmonary effort is normal. No respiratory distress.     Breath sounds: Normal breath sounds. No wheezing, rhonchi or rales.  Abdominal:     General: Bowel sounds are normal. There is no distension.     Palpations: Abdomen is soft. There is no mass.     Tenderness: There is no abdominal tenderness.  Musculoskeletal:        General: No swelling.     Right lower leg: No edema.     Left lower leg: No edema.  Lymphadenopathy:     Cervical: No cervical adenopathy.     Upper Body:     Right upper body: No supraclavicular or axillary adenopathy.      Left upper body: No supraclavicular or axillary adenopathy.     Lower Body: No right inguinal adenopathy. No left inguinal adenopathy.  Skin:    General: Skin is warm.     Coloration: Skin is not jaundiced.     Findings: No lesion or rash.  Neurological:     General: No focal deficit present.     Mental Status: She is alert and oriented to person, place, and time. Mental status is at baseline.  Psychiatric:        Mood and Affect: Mood normal.        Behavior: Behavior normal.        Thought Content: Thought content normal.    LABS:      Latest Ref Rng & Units 11/02/2023    1:55 PM 08/04/2023    1:46 PM 03/09/2013    5:00 AM  CBC  WBC 4.0 - 10.5 K/uL 8.0  7.3  9.3   Hemoglobin 12.0 - 15.0 g/dL 91.4  8.9  78.2   Hematocrit 36.0 - 46.0 % 39.4  29.9  34.9   Platelets 150 - 400 K/uL 210  310  192       Latest Ref Rng & Units 08/13/2021    3:55 PM 03/08/2013    5:00 AM  03/07/2013    3:35 PM  CMP  Glucose 70 - 99 mg/dL 161  096    BUN 8 - 27 mg/dL 12  10    Creatinine 0.45 - 1.00 mg/dL 4.09  8.11  9.14   Sodium 134 - 144 mmol/L 137  133    Potassium 3.5 - 5.2 mmol/L 4.2  3.4    Chloride 96 - 106 mmol/L 99  96    CO2 20 - 29 mmol/L 24  28    Calcium 8.7 - 10.3 mg/dL 9.8  9.2    Total Protein 6.0 - 8.5 g/dL 7.1     Total Bilirubin 0.0 - 1.2 mg/dL <7.8     Alkaline Phos 44 - 121 IU/L 97     AST 0 - 40 IU/L 16     ALT 0 - 32 IU/L 12       Latest Reference Range & Units 11/02/23 13:55  Iron 28 - 170 ug/dL 74  UIBC ug/dL 295  TIBC 621 - 308 ug/dL 657  Saturation Ratios 10.4 - 31.8 % 19  Ferritin 11 - 307 ng/mL 35   ASSESSMENT & PLAN:  An 83 y.o. female with iron deficiency anemia.  I am very pleased with the improvement in her iron and hemoglobin levels since she received her IV iron in January 2025.  Clinically, she appears to be doing much better.  Although she is 83 years old, she is very healthy to where I think it would be worth her to undergo a GI workup to ensure no  underlying GI tract pathology is behind her iron deficiency anemia.  She is scheduled to see her GI doctor very soon, for which she we will discuss this with him.  Otherwise, as she is doing well, I will see her back in 6 months for repeat clinical assessment.  The patient understands all the plans discussed today and is in agreement with them.  Shailah Gibbins Kirby Funk, MD

## 2023-11-03 ENCOUNTER — Telehealth: Payer: Self-pay

## 2023-11-03 ENCOUNTER — Encounter: Payer: Self-pay | Admitting: Cardiology

## 2023-11-03 ENCOUNTER — Ambulatory Visit: Payer: Medicare Other | Attending: Cardiology | Admitting: Cardiology

## 2023-11-03 VITALS — BP 148/78 | HR 63 | Ht 64.0 in | Wt 178.6 lb

## 2023-11-03 DIAGNOSIS — I351 Nonrheumatic aortic (valve) insufficiency: Secondary | ICD-10-CM | POA: Diagnosis not present

## 2023-11-03 DIAGNOSIS — I25119 Atherosclerotic heart disease of native coronary artery with unspecified angina pectoris: Secondary | ICD-10-CM | POA: Diagnosis not present

## 2023-11-03 DIAGNOSIS — I10 Essential (primary) hypertension: Secondary | ICD-10-CM

## 2023-11-03 DIAGNOSIS — R0609 Other forms of dyspnea: Secondary | ICD-10-CM

## 2023-11-03 DIAGNOSIS — J4520 Mild intermittent asthma, uncomplicated: Secondary | ICD-10-CM | POA: Diagnosis not present

## 2023-11-03 MED ORDER — APIXABAN 5 MG PO TABS: 5.0000 mg | ORAL_TABLET | Freq: Two times a day (BID) | ORAL | Status: AC

## 2023-11-03 NOTE — Patient Instructions (Signed)

## 2023-11-03 NOTE — Telephone Encounter (Signed)
  Latest Reference Range & Units 11/02/23 13:55   Iron 28 - 170 ug/dL 74  UIBC ug/dL 829  TIBC 562 - 130 ug/dL 865  Saturation Ratios 10.4 - 31.8 % 19  Ferritin 11 - 307 ng/mL 35    ASSESSMENT & PLAN:  An 83 y.o. female with iron deficiency anemia.  I am very pleased with the improvement in her iron and hemoglobin levels since she received her IV iron in January 2025.  Clinically, she appears to be doing much better.  Although she is 83 years old, she is very healthy to where I think it would be worth her to undergo a GI workup to ensure no underlying GI tract pathology is behind her iron deficiency anemia.  She is scheduled to see her GI doctor very soon, for which she we will discuss this with him.  Otherwise, as she is doing well, I will see her back in 6 months for repeat clinical assessment.  The patient understands all the plans discussed today and is in agreement with them.   Weston Settle, MD                 Electronically signed by Weston Settle, MD at 11/02/2023  6:49 PM

## 2023-11-03 NOTE — Progress Notes (Unsigned)
 Cardiology Office Note:    Date:  11/03/2023   ID:  Gari, Trovato [birth year], MRN [id number]  PCP:  Hadley Pen, MD  Cardiologist:  Gypsy Balsam, MD    Referring MD: Hadley Pen, MD   Chief Complaint  Patient presents with   Follow-up    History of Present Illness:    Patricia Dawson is a 83 y.o. female past medical history significant for paroxysmal atrial fibrillation, she is on Eliquis, remote history of CVA, coronary artery disease status post coronary bypass graft done in 2009, dyslipidemia, intolerant to statins refused to take any medication for it.  Comes today to months for follow-up neurology she is doing great.  She denies any chest pain tightness squeezing pressure burning chest sometimes shortness of breath sometimes aggravation of her bronchial asthma which she thinks will follow in the TAVR prominent right now.  Otherwise seems to be doing well  Past Medical History:  Diagnosis Date   Abnormal nuclear stress test 09/23/2018   Acute ischemic stroke (HCC) 10/04/2018   Anxiety 01/26/2017   Arthritis    "right knee"  (03/07/2013)   Atopic dermatitis 06/03/2018   Atrial fibrillation by electrocardiography (HCC) 08/17/2019   Back pain, lumbosacral 01/26/2017   Bradycardia 08/17/2019   Breast pain, left 01/26/2017   Formatting of this note might be different from the original. diagnostic mammogram   Candidiasis of mouth and esophagus (HCC) 01/26/2017   Overview:  magic mouth wash tid  Formatting of this note might be different from the original. magic mouth wash tid   Carpal tunnel syndrome 01/26/2017   Overview:  declines further eval at this time  Formatting of this note might be different from the original. 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 declines further eval at this time  Formatting of this note  might be different from the original. declines further eval at this time   Cerebral infarction due to embolism of left middle cerebral artery (HCC) 06/06/2020   Chronic bilateral low back pain without sciatica 01/26/2017   Overview:  Lumbar spine films at [facility] of this note might be different from the original. 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 Follow up if symptoms do not progressively improve or worsen.  Formatting of this note might be different from the original. Decongestant and valsalva exercises. Follow up if symptoms do not progressively improve or worsen.   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 diagnostic mammogram at [facility]   Medicare annual wellness visit, subsequent 04/27/2019   Mild intermittent asthma 07/15/2018   Nocturnal hypoxemia 01/26/2017   Formatting of this note might be different from the original. 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 decrease metformin to 1 daily   Type II diabetes mellitus (HCC)    Unspecified osteoarthritis, unspecified site 01/26/2017   Visual changes 03/26/2021   Vitamin D deficiency 01/26/2017   Wheezing 01/26/2017    Past Surgical History:  Procedure Laterality Date   AXILLARY LYMPH NODE DISSECTION Right    Cataract  surgery Right 06/2020   Will have L eye done on 08/14/2020   CHOLECYSTECTOMY     CORONARY ANGIOPLASTY     CORONARY ARTERY BYPASS GRAFT  2009   "CABG X4" (03/07/2013)   MEDIAL PARTIAL KNEE REPLACEMENT Right 03/07/2013   MEDIAL PARTIAL KNEE REPLACEMENT Right 03/07/2013   Procedure: MEDIAL PARTIAL KNEE REPLACEMENT;  Surgeon: Dannielle Huh, MD;  Location: [facility] OR;  Service: Orthopedics;  Laterality: Right;   TONSILLECTOMY AND ADENOIDECTOMY  ~ 1968   VAGINAL HYSTERECTOMY  Current Medications: Current Meds  Medication Sig   albuterol (PROVENTIL) (2.5 MG/3ML) 0.083% nebulizer solution Take 2.5 mg by nebulization every 6 (six) hours as needed for wheezing or shortness of breath.   BIOTIN 5000 PO Take 1 tablet by mouth daily.   cholecalciferol (VITAMIN D) 1000 UNITS tablet Take 1,000 Units by mouth daily.   CONTOUR NEXT TEST test strip 1 each by Other route as needed for other (GLucose check).   Cyanocobalamin (VITAMIN B 12 PO) Take 2,000 mg by mouth daily.   Dextromethorphan-guaiFENesin (MUCINEX FAST-MAX DM MAX PO) Take 1 capsule by mouth as needed (congestion).   diltiazem (CARDIZEM CD) 240 MG 24 hr capsule Take 1 capsule (240 mg total) by mouth daily.    fluticasone-salmeterol (ADVAIR) 250-50 MCG/ACT AEPB Inhale 1 puff into the lungs 2 (two) times daily.   furosemide (LASIX) 40 MG tablet Take 40 mg by mouth daily.   ipratropium (ATROVENT) 0.02 % nebulizer solution Take 0.2 mg by nebulization 4 (four) times daily.   lisinopril (ZESTRIL) 40 MG tablet Take 40 mg by mouth in the morning and at bedtime.   Melatonin 10 MG TABS Take 1 tablet by mouth at bedtime.   metFORMIN (GLUCOPHAGE) 500 MG tablet Take 500 mg by mouth 2 (two) times daily with a meal.   Microlet Lancets MISC 1 each by Other route daily.   Misc Natural Products (NEURIVA PO) Take 1 tablet by mouth daily. Unknown strenght   nitroGLYCERIN (NITROSTAT) 0.4 MG SL tablet Place 0.4 mg under the tongue every 5 (five) minutes as needed for chest pain.   omeprazole (PRILOSEC) 20 MG capsule Take 1 capsule by mouth daily.   predniSONE (DELTASONE) 20 MG tablet Take 20 mg by mouth daily.   ranolazine (RANEXA) 500 MG 12 hr tablet TAKE ONE TABLET BY MOUTH TWICE DAILY (Patient taking differently: Take 500 mg by mouth 2 (two) times daily.)   traZODone (DESYREL) 100 MG tablet Take 100 mg by mouth at bedtime.   [DISCONTINUED] apixaban (ELIQUIS) 5 MG TABS tablet Take 5 mg by mouth 2 (two) times daily.     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 / [employer]  Tobacco Use   Smoking status: Never   Smokeless tobacco: Never  Substance and Sexual Activity   Alcohol use: No   Drug use: No   Sexual activity: Not Currently  Other Topics Concern   Not on file  Social History Narrative   Not on file   Social Drivers of Health   Financial Resource Strain: Not on  file  Food Insecurity: No Food Insecurity (08/04/2023)   Hunger Vital Sign    Worried About Running Out of Food in the Last Year: Never true    Ran Out of Food in the Last Year: Never true  Transportation Needs: No Transportation Needs (08/04/2023)   PRAPARE - Administrator, Civil Service (Medical): No    Lack of Transportation (Non-Medical): No  Physical Activity: Not on file  Stress: Not on file  Social Connections: Not on file     Family History: The patient's family history includes Arthritis in her mother; Arthritis/Rheumatoid in her sister; Breast cancer in her  paternal aunt, paternal aunt, sister, and sister; CVA in her father; Diabetes in her mother; Heart disease in her brother, father, mother, and sister; Heart failure in her mother; Lymphoma in her child. ROS:   Please see the history of present illness.    All 14 point review of systems negative except as described per history of present illness  EKGs/Labs/Other Studies Reviewed:    EKG Interpretation Date/Time:  Tuesday November 03 2023 13:20:42 EDT Ventricular Rate:  63 PR Interval:  182 QRS Duration:  76 QT Interval:  418 QTC Calculation: 427 R Axis:   -27  Text Interpretation: Normal sinus rhythm Low voltage QRS Cannot rule out Anterior infarct , age undetermined Abnormal ECG No previous ECGs available Confirmed by Gypsy Balsam [phone]) on 11/03/2023 1:38:06 PM    Recent Labs: 11/02/2023: Hemoglobin 13.1; Platelet Count 210  Recent Lipid Panel    Component Value Date/Time   CHOL 213 (H) 09/03/2020 1309   TRIG 102 09/03/2020 1309   HDL 55 09/03/2020 1309   CHOLHDL 3.9 09/03/2020 1309   LDLCALC 140 (H) 09/03/2020 1309   LDLDIRECT 119 (H) 08/13/2021 1555    Physical Exam:    VS:  BP (!) 148/78 (BP Location: Right Arm, Patient Position: Sitting)   Pulse 63   Ht 5\' 4"  (1.626 m)   Wt 178 lb 9.6 oz (81 kg)   SpO2 96%   BMI 30.66 kg/m     Wt Readings from Last 3 Encounters:  11/03/23 178 lb  9.6 oz (81 kg)  11/02/23 176 lb (79.8 kg)  08/10/23 180 lb (81.6 kg)     GEN:  Well nourished, well developed in no acute distress HEENT: Normal NECK: No JVD; No carotid bruits LYMPHATICS: No lymphadenopathy CARDIAC: RRR, systolic murmur grade 1/6 to 2/6 best heard at left border of sternum, no rubs, no gallops RESPIRATORY:  Clear to auscultation without rales, wheezing or rhonchi  ABDOMEN: Soft, non-tender, non-distended MUSCULOSKELETAL:  No edema; No deformity  SKIN: Warm and dry LOWER EXTREMITIES: no swelling NEUROLOGIC:  Alert and oriented x 3 PSYCHIATRIC:  Normal affect   ASSESSMENT:    1. Essential hypertension   2. Nonrheumatic aortic valve insufficiency   3. Coronary artery disease involving native coronary artery of native heart with angina pectoris (HCC)   4. Mild intermittent asthma, unspecified whether complicated    PLAN:    In order of problems listed above:  Essential hypertension: Blood pressure well-controlled continue present management. Nonrheumatic arctic valve insufficiency, echocardiogram will be done to reassess the lesion. Coronary disease status post coronary bypass graft stable does not have any signs and symptoms of reactivation of the problem. Mild intermittent asthma she uses nebulizer on the regular basis and it helped her a lot. Dyslipidemia again she does not want to take any cholesterol medication her LDL of 140 HDL 55 she understand consequences of this   Medication Adjustments/Labs and Tests Ordered: Current medicines are reviewed at length with the patient today.  Concerns regarding medicines are outlined above.  Orders Placed This Encounter  Procedures   EKG 12-Lead   Medication changes:  Meds ordered this encounter  Medications   apixaban (ELIQUIS) 5 MG TABS tablet    Sig: Take 1 tablet (5 mg total) by mouth 2 (two) times daily.    Lot Number?:   [id number]    Expiration Date?:   01/31/2025    Quantity:   42    Signed, Georgeanna Lea, MD, [facility] 11/03/2023 1:46 PM  [facility]

## 2023-11-12 ENCOUNTER — Telehealth: Payer: Self-pay

## 2023-11-12 NOTE — Telephone Encounter (Signed)
 Pharmacy please advise on holding Eliquis prior to EGD and colonoscopy scheduled for 12/16/2023. Thank you.

## 2023-11-12 NOTE — Telephone Encounter (Signed)
 Dr. Bing Matter  We have received a surgical clearance request for Patricia Dawson for EGD and colonoscopy. They were seen recently in clinic on 11/03/2023. Can you please comment on surgical clearance for upcoming procedures.  She is scheduled for an echo on 11/25/2023.  Please forward you guidance and recommendations to [facility] PREOP   Thanks, Robin Searing, NP

## 2023-11-12 NOTE — Telephone Encounter (Signed)
   Pre-operative Risk Assessment    Patient Name: Patricia Dawson  DOB: 09/05/1940 MRN: [id number]  Date of last office visit: 11/03/2023 Date of next office visit: 11/25/2023   Request for Surgical Clearance   Procedure:   Colonoscopy and EGD Date of Surgery:  Clearance 12/16/2023                              Surgeon:  Dr. Marcial Pacas Misenheimer  Surgeon's Group or Practice Name:  [facility]  Phone number:  [phone] Fax number:  [phone] Type of Clearance Requested:   - Medical  - Pharmacy:  Hold Apixaban (Eliquis) Start holding on 12/14/2023 Type of Anesthesia:  Not Indicated   Additional requests/questions:    Signed, [place] C Sarah-Jane Nazario   11/12/2023, 3:22 PM

## 2023-11-13 NOTE — Telephone Encounter (Signed)
 Patient with diagnosis of atrial fibrillation on Eliquis for anticoagulation.    Procedure:   Colonoscopy and EGD Date of Surgery:  Clearance 12/16/2023     CHA2DS2-VASc Score = 9   This indicates a 12.2% annual risk of stroke. The patient's score is based upon: CHF History: 1 HTN History: 1 Diabetes History: 1 Stroke History: 2 Vascular Disease History: 1 Age Score: 2 Gender Score: 1   Per chart stroke occurred shortly before 08/2019 visit.  Listed as complication from back surgery.    CrCl 80 Platelet count 210  Request is for 2 day hold, will have to confirm with primary cardiologist because of high CHADS2-VASc score.    **This guidance is not considered finalized until pre-operative APP has relayed final recommendations.**

## 2023-11-18 NOTE — Telephone Encounter (Signed)
   Patient Name: Patricia Dawson  DOB: 08-26-1940 MRN: [id number]  Primary Cardiologist: None  Chart reviewed as part of pre-operative protocol coverage. Given past medical history and time since last visit, based on ACC/AHA guidelines, Nailyn Dearinger is at acceptable risk for the planned procedure without further cardiovascular testing.   Per primary cardiologist patient can hold Eliquis 2 days prior to procedure and should restart postprocedure when surgically safe and hemostasis is achieved.  The patient was advised that if she develops new symptoms prior to surgery to contact our office to arrange for a follow-up visit, and she verbalized understanding.  I will route this recommendation to the requesting party via Epic fax function and remove from pre-op pool.  Please call with questions.  Francene Ing, Retha Cast, NP 11/18/2023, 9:42 AM

## 2023-11-25 ENCOUNTER — Ambulatory Visit: Attending: Cardiology

## 2023-11-25 DIAGNOSIS — R0609 Other forms of dyspnea: Secondary | ICD-10-CM | POA: Diagnosis not present

## 2023-11-26 LAB — ECHOCARDIOGRAM COMPLETE
AR max vel: 2.21 cm2
AV Area VTI: 2.47 cm2
AV Area mean vel: 2.21 cm2
AV Mean grad: 4.3 mmHg
AV Peak grad: 10 mmHg
AV Vena cont: 0.2 cm
Ao pk vel: 1.58 m/s
Area-P 1/2: 2.95 cm2
P 1/2 time: 556 ms
S' Lateral: 2.8 cm

## 2023-12-29 ENCOUNTER — Other Ambulatory Visit: Payer: Self-pay | Admitting: Cardiology

## 2024-04-18 ENCOUNTER — Other Ambulatory Visit (HOSPITAL_BASED_OUTPATIENT_CLINIC_OR_DEPARTMENT_OTHER): Payer: Self-pay | Admitting: Family Medicine

## 2024-04-18 ENCOUNTER — Encounter: Payer: Self-pay | Admitting: Oncology

## 2024-04-18 DIAGNOSIS — Z1231 Encounter for screening mammogram for malignant neoplasm of breast: Secondary | ICD-10-CM

## 2024-04-28 ENCOUNTER — Ambulatory Visit: Attending: Cardiology | Admitting: Cardiology

## 2024-04-28 ENCOUNTER — Encounter: Payer: Self-pay | Admitting: Cardiology

## 2024-04-28 VITALS — BP 150/70 | HR 61 | Ht 64.0 in | Wt 179.0 lb

## 2024-04-28 DIAGNOSIS — I5032 Chronic diastolic (congestive) heart failure: Secondary | ICD-10-CM | POA: Diagnosis not present

## 2024-04-28 DIAGNOSIS — I480 Paroxysmal atrial fibrillation: Secondary | ICD-10-CM | POA: Diagnosis not present

## 2024-04-28 DIAGNOSIS — I10 Essential (primary) hypertension: Secondary | ICD-10-CM

## 2024-04-28 DIAGNOSIS — E1165 Type 2 diabetes mellitus with hyperglycemia: Secondary | ICD-10-CM

## 2024-04-28 NOTE — Progress Notes (Signed)
 Cardiology Office Note:    Date:  04/28/2024   ID:  Patricia Dawson [birth year], MRN [id number]  PCP:  Silver Lamar LABOR, MD  Cardiologist:  Lamar Fitch, MD    Referring MD: Silver Lamar LABOR, MD   Chief Complaint  Patient presents with   Follow-up  Doing well  History of Present Illness:    Patricia Dawson is a 83 y.o. female past medical history significant for paroxysmal atrial fibrillation she is on Eliquis , remote history of CVA, coronary disease status postcoronary bypass grafting 2009, dyslipidemia, intolerance to multiple statins refused to take any additional medications. Comes today to months for follow-up overall doing great.  She denies have any chest pain tightness squeezing pressure burning chest.  She walks around with a cane overall feels good  Past Medical History:  Diagnosis Date   Abnormal nuclear stress test 09/23/2018   Acute ischemic stroke (HCC) 10/04/2018   Anxiety 01/26/2017   Arthritis    right knee  (03/07/2013)   Atopic dermatitis 06/03/2018   Atrial fibrillation by electrocardiography (HCC) 08/17/2019   Back pain, lumbosacral 01/26/2017   Bradycardia 08/17/2019   Breast pain, left 01/26/2017   Formatting of this note might be different from the original. diagnostic mammogram   Candidiasis of mouth and esophagus (HCC) 01/26/2017   Overview:  magic mouth wash tid  Formatting of this note might be different from the original. magic mouth wash tid   Carpal tunnel syndrome 01/26/2017   Overview:  declines further eval at this time  Formatting of this note might be different from the original. 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 declines further eval at this time  Formatting of this note might be different from the original. declines further eval at this time   Cerebral infarction due to embolism of  left middle cerebral artery (HCC) 06/06/2020   Chronic bilateral low back pain without sciatica 01/26/2017   Overview:  Lumbar spine films at [facility] of this note might be different from the original. 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 Follow up if symptoms do not progressively improve or worsen.  Formatting of this note might be different from the original. Decongestant and valsalva exercises. Follow up if symptoms do not progressively improve or worsen.   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 diagnostic mammogram at [facility]   Medicare annual wellness visit, subsequent 04/27/2019   Mild intermittent asthma 07/15/2018   Nocturnal hypoxemia 01/26/2017   Formatting of this note might be different from the original. 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 decrease metformin  to 1 daily   Type II diabetes mellitus (HCC)    Unspecified osteoarthritis, unspecified site 01/26/2017   Visual changes 03/26/2021   Vitamin D deficiency 01/26/2017   Wheezing 01/26/2017    Past Surgical History:  Procedure Laterality Date   AXILLARY LYMPH NODE DISSECTION Right    Cataract  surgery Right 06/2020   Will have L eye done on 08/14/2020   CHOLECYSTECTOMY     CORONARY ANGIOPLASTY     CORONARY ARTERY BYPASS GRAFT  2009   CABG X4 (03/07/2013)   MEDIAL PARTIAL KNEE REPLACEMENT Right 03/07/2013   MEDIAL PARTIAL KNEE REPLACEMENT Right 03/07/2013   Procedure: MEDIAL PARTIAL KNEE REPLACEMENT;  Surgeon: Marcey Raman, MD;  Location: [facility] OR;  Service: Orthopedics;  Laterality: Right;   TONSILLECTOMY AND ADENOIDECTOMY  ~ 1968   VAGINAL HYSTERECTOMY      Current Medications: Current Meds  Medication Sig   albuterol  (PROVENTIL ) (2.5 MG/3ML) 0.083% nebulizer solution Take 2.5 mg by nebulization every  6 (six) hours as needed for wheezing or shortness of breath.   apixaban  (ELIQUIS ) 5 MG TABS tablet Take 1 tablet (5 mg total) by mouth 2 (two) times daily.   BIOTIN 5000 PO Take 1 tablet by mouth daily.   cholecalciferol (VITAMIN D) 1000 UNITS tablet Take 1,000 Units by mouth daily.   CONTOUR NEXT TEST test strip 1 each by Other route as needed for other (GLucose check).   Cyanocobalamin (VITAMIN B 12 PO) Take 2,000 mg by mouth daily.   Dextromethorphan-guaiFENesin (MUCINEX FAST-MAX DM MAX PO) Take 1 capsule by mouth as needed (congestion).   diltiazem  (CARDIZEM  CD) 240 MG 24 hr capsule Take 1 capsule (240 mg total) by mouth daily.   furosemide (LASIX) 40 MG tablet Take 40 mg  by mouth daily.   ipratropium (ATROVENT) 0.02 % nebulizer solution Take 0.2 mg by nebulization 4 (four) times daily.   lisinopril  (ZESTRIL ) 40 MG tablet Take 40 mg by mouth in the morning and at bedtime.   Melatonin 10 MG TABS Take 1 tablet by mouth at bedtime.   metFORMIN  (GLUCOPHAGE ) 500 MG tablet Take 500 mg by mouth 2 (two) times daily with a meal.   Microlet Lancets MISC 1 each by Other route daily.   Misc Natural Products (NEURIVA PO) Take 1 tablet by mouth daily. Unknown strenght   nitroGLYCERIN (NITROSTAT) 0.4 MG SL tablet Place 0.4 mg under the tongue every 5 (five) minutes as needed for chest pain.   omeprazole (PRILOSEC) 20 MG capsule Take 1 capsule by mouth daily.   ranolazine  (RANEXA ) 500 MG 12 hr tablet TAKE ONE TABLET BY MOUTH TWICE DAILY   traZODone (DESYREL) 100 MG tablet Take 100 mg by mouth at bedtime.     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 / [employer]  Tobacco Use   Smoking status: Never   Smokeless tobacco: Never  Substance and Sexual Activity   Alcohol use: No   Drug use: No   Sexual activity: Not Currently  Other Topics Concern   Not on file  Social History Narrative   Not on file   Social Drivers of Health   Financial Resource Strain: Not on file  Food Insecurity: No Food Insecurity (08/04/2023)   Hunger Vital Sign    Worried About Running Out of Food in the Last Year: Never true    Ran Out of Food in the Last Year: Never true  Transportation Needs: No Transportation Needs (08/04/2023)   PRAPARE - Administrator, Civil Service (Medical): No    Lack of Transportation (Non-Medical): No  Physical  Activity: Not on file  Stress: Not on file  Social Connections: Not on file     Family History: The patient's family history includes Arthritis in her mother; Arthritis/Rheumatoid in her sister; Breast cancer in her paternal aunt, paternal aunt, sister, and sister; CVA in her father; Diabetes in her mother; Heart disease in her brother, father, mother, and sister; Heart failure in her mother; Lymphoma in her child. ROS:   Please see the history of present illness.    All 14 point review of systems negative except as described per history of present illness  EKGs/Labs/Other Studies Reviewed:         Recent Labs: 11/02/2023: Hemoglobin 13.1; Platelet Count 210  Recent Lipid Panel    Component Value Date/Time   CHOL 213 (H) 09/03/2020 1309   TRIG 102 09/03/2020 1309   HDL 55 09/03/2020 1309   CHOLHDL 3.9 09/03/2020 1309   LDLCALC 140 (H) 09/03/2020 1309   LDLDIRECT 119 (H) 08/13/2021 1555    Physical Exam:    VS:  BP (!) 150/70   Pulse 61   Ht 5' 4 (1.626 m)   Wt 179 lb (81.2 kg)   SpO2 96%   BMI 30.73 kg/m     Wt Readings from Last 3 Encounters:  04/28/24 179 lb (81.2 kg)  11/03/23 178 lb 9.6 oz (81 kg)  11/02/23 176 lb (79.8 kg)     GEN:  Well nourished, well developed in no acute distress HEENT: Normal NECK: No JVD; No carotid bruits LYMPHATICS: No lymphadenopathy CARDIAC: RRR, no murmurs, no rubs, no gallops RESPIRATORY:  Clear to auscultation without rales, wheezing or rhonchi  ABDOMEN: Soft, non-tender, non-distended MUSCULOSKELETAL:  No edema; No deformity  SKIN: Warm and dry LOWER EXTREMITIES: no swelling NEUROLOGIC:  Alert and oriented x 3 PSYCHIATRIC:  Normal affect   ASSESSMENT:    1. Chronic diastolic (congestive) heart failure (HCC)   2. Essential hypertension   3. Paroxysmal atrial fibrillation (HCC)   4. Type 2 diabetes mellitus with hyperglycemia, without long-term current use of insulin  (HCC)    PLAN:    In order of problems listed  above:  Coronary disease stable status post coronary bypass graft.  Noted, continue present management. Essential hypertension blood pressure always slightly elevated in the office but normal at home will continue present management. Paroxysmal atrial fibrillation denies have any palpitations, she is anticoagulated which I will continue. Type 2 diabetes will be followed by internal medicine team. Dyslipidemia last fasting B12 I have is from 2022 with LDL 140 HDL 55 again Long discussion about potentially taking some different cholesterol medication she does not want to do it   Medication Adjustments/Labs and Tests Ordered: Current medicines are reviewed at length with the patient today.  Concerns regarding medicines are outlined above.  No orders of the defined types were placed in this encounter.  Medication changes: No orders of the defined types were placed in this encounter.   Signed, Lamar DOROTHA Fitch, MD, [facility] 04/28/2024 2:40 PM    [facility]

## 2024-04-28 NOTE — Patient Instructions (Signed)

## 2024-05-02 NOTE — Progress Notes (Unsigned)
 [facility]  [address] [place],  [place]  [address] [phone]  Clinic Day:  05/02/2024  Referring physician: Silver Lamar LABOR, MD   HISTORY OF PRESENT ILLNESS:  The patient is an 83 y.o. female with iron  deficiency anemia.  She comes in today to reassess her iron  and hemoglobin levels after receiving IV iron  in January 2025.  Overall, she has definitely noticed an improvement in how she has felt since her IV iron  was given.  She continues to deny having any overt forms of blood loss.   PHYSICAL EXAM:  There were no vitals taken for this visit. Wt Readings from Last 3 Encounters:  04/28/24 179 lb (81.2 kg)  11/03/23 178 lb 9.6 oz (81 kg)  11/02/23 176 lb (79.8 kg)   There is no height or weight on file to calculate BMI. Performance status (ECOG): 1 - Symptomatic but completely ambulatory Physical Exam Constitutional:      Appearance: Normal appearance. She is not ill-appearing.  HENT:     Mouth/Throat:     Mouth: Mucous membranes are moist.     Pharynx: Oropharynx is clear. No oropharyngeal exudate or posterior oropharyngeal erythema.  Cardiovascular:     Rate and Rhythm: Normal rate and regular rhythm.     Heart sounds: No murmur heard.    No friction rub. No gallop.  Pulmonary:     Effort: Pulmonary effort is normal. No respiratory distress.     Breath sounds: Normal breath sounds. No wheezing, rhonchi or rales.  Abdominal:     General: Bowel sounds are normal. There is no distension.     Palpations: Abdomen is soft. There is no mass.     Tenderness: There is no abdominal tenderness.  Musculoskeletal:        General: No swelling.     Right lower leg: No edema.     Left lower leg: No edema.  Lymphadenopathy:     Cervical: No cervical adenopathy.     Upper Body:     Right upper body: No supraclavicular or axillary adenopathy.     Left upper body: No supraclavicular or axillary adenopathy.     Lower Body: No right inguinal  adenopathy. No left inguinal adenopathy.  Skin:    General: Skin is warm.     Coloration: Skin is not jaundiced.     Findings: No lesion or rash.  Neurological:     General: No focal deficit present.     Mental Status: She is alert and oriented to person, place, and time. Mental status is at baseline.  Psychiatric:        Mood and Affect: Mood normal.        Behavior: Behavior normal.        Thought Content: Thought content normal.    LABS:      Latest Ref Rng & Units 11/02/2023    1:55 PM 08/04/2023    1:46 PM 03/09/2013    5:00 AM  CBC  WBC 4.0 - 10.5 K/uL 8.0  7.3  9.3   Hemoglobin 12.0 - 15.0 g/dL 86.8  8.9  88.5   Hematocrit 36.0 - 46.0 % 39.4  29.9  34.9   Platelets 150 - 400 K/uL 210  310  192       Latest Ref Rng & Units 08/13/2021    3:55 PM 03/08/2013    5:00 AM 03/07/2013    3:35 PM  CMP  Glucose 70 - 99 mg/dL 889  139    BUN 8 - 27 mg/dL 12  10    Creatinine 9.42 - 1.00 mg/dL 9.30  9.47  9.45   Sodium 134 - 144 mmol/L 137  133    Potassium 3.5 - 5.2 mmol/L 4.2  3.4    Chloride 96 - 106 mmol/L 99  96    CO2 20 - 29 mmol/L 24  28    Calcium  8.7 - 10.3 mg/dL 9.8  9.2    Total Protein 6.0 - 8.5 g/dL 7.1     Total Bilirubin 0.0 - 1.2 mg/dL <9.7     Alkaline Phos 44 - 121 IU/L 97     AST 0 - 40 IU/L 16     ALT 0 - 32 IU/L 12       Latest Reference Range & Units 11/02/23 13:55  Iron  28 - 170 ug/dL 74  UIBC ug/dL 692  TIBC 749 - 549 ug/dL 618  Saturation Ratios 10.4 - 31.8 % 19  Ferritin 11 - 307 ng/mL 35   ASSESSMENT & PLAN:  An 83 y.o. female with iron  deficiency anemia.  I am very pleased with the improvement in her iron  and hemoglobin levels since she received her IV iron  in January 2025.  Clinically, she appears to be doing much better.  Although she is 83 years old, she is very healthy to where I think it would be worth her to undergo a GI workup to ensure no underlying GI tract pathology is behind her iron  deficiency anemia.  She is scheduled to see her GI  doctor very soon, for which she we will discuss this with him.  Otherwise, as she is doing well, I will see her back in 6 months for repeat clinical assessment.  The patient understands all the plans discussed today and is in agreement with them.  Kaydance Bowie DELENA Kerns, MD

## 2024-05-03 ENCOUNTER — Other Ambulatory Visit

## 2024-05-03 ENCOUNTER — Inpatient Hospital Stay: Attending: Oncology

## 2024-05-03 ENCOUNTER — Telehealth: Payer: Self-pay | Admitting: Oncology

## 2024-05-03 ENCOUNTER — Encounter (HOSPITAL_BASED_OUTPATIENT_CLINIC_OR_DEPARTMENT_OTHER): Payer: Self-pay | Admitting: Radiology

## 2024-05-03 ENCOUNTER — Ambulatory Visit (HOSPITAL_BASED_OUTPATIENT_CLINIC_OR_DEPARTMENT_OTHER)
Admission: RE | Admit: 2024-05-03 | Discharge: 2024-05-03 | Disposition: A | Source: Ambulatory Visit | Attending: Family Medicine | Admitting: Family Medicine

## 2024-05-03 ENCOUNTER — Ambulatory Visit: Admitting: Oncology

## 2024-05-03 ENCOUNTER — Inpatient Hospital Stay (HOSPITAL_BASED_OUTPATIENT_CLINIC_OR_DEPARTMENT_OTHER): Admitting: Oncology

## 2024-05-03 ENCOUNTER — Other Ambulatory Visit: Payer: Self-pay | Admitting: Oncology

## 2024-05-03 VITALS — BP 158/85 | HR 74 | Temp 97.9°F | Resp 14 | Ht 64.0 in | Wt 177.6 lb

## 2024-05-03 DIAGNOSIS — D508 Other iron deficiency anemias: Secondary | ICD-10-CM | POA: Diagnosis not present

## 2024-05-03 DIAGNOSIS — Z1231 Encounter for screening mammogram for malignant neoplasm of breast: Secondary | ICD-10-CM

## 2024-05-03 DIAGNOSIS — D509 Iron deficiency anemia, unspecified: Secondary | ICD-10-CM | POA: Diagnosis present

## 2024-05-03 LAB — IRON AND TIBC
Iron: 54 ug/dL (ref 28–170)
Saturation Ratios: 14 % (ref 10.4–31.8)
TIBC: 395 ug/dL (ref 250–450)
UIBC: 341 ug/dL

## 2024-05-03 LAB — CMP (CANCER CENTER ONLY)
ALT: 9 U/L (ref 0–44)
AST: 14 U/L — ABNORMAL LOW (ref 15–41)
Albumin: 4.2 g/dL (ref 3.5–5.0)
Alkaline Phosphatase: 96 U/L (ref 38–126)
Anion gap: 12 (ref 5–15)
BUN: 11 mg/dL (ref 8–23)
CO2: 26 mmol/L (ref 22–32)
Calcium: 9.6 mg/dL (ref 8.9–10.3)
Chloride: 101 mmol/L (ref 98–111)
Creatinine: 0.65 mg/dL (ref 0.44–1.00)
GFR, Estimated: >60 mL/min (ref >60)
Glucose, Bld: 103 mg/dL — ABNORMAL HIGH (ref 70–99)
Potassium: 3.7 mmol/L (ref 3.5–5.1)
Sodium: 139 mmol/L (ref 135–145)
Total Bilirubin: 0.3 mg/dL (ref 0.0–1.2)
Total Protein: 7.5 g/dL (ref 6.5–8.1)

## 2024-05-03 LAB — CBC WITH DIFFERENTIAL (CANCER CENTER ONLY)
Abs Immature Granulocytes: 0.02 K/uL (ref 0.00–0.07)
Basophils Absolute: 0 K/uL (ref 0.0–0.1)
Basophils Relative: 0 %
Eosinophils Absolute: 0.1 K/uL (ref 0.0–0.5)
Eosinophils Relative: 1 %
HCT: 39.8 % (ref 36.0–46.0)
Hemoglobin: 13 g/dL (ref 12.0–15.0)
Immature Granulocytes: 0 %
Lymphocytes Relative: 31 %
Lymphs Abs: 2.1 K/uL (ref 0.7–4.0)
MCH: 29.2 pg (ref 26.0–34.0)
MCHC: 32.7 g/dL (ref 30.0–36.0)
MCV: 89.4 fL (ref 80.0–100.0)
Monocytes Absolute: 0.5 K/uL (ref 0.1–1.0)
Monocytes Relative: 7 %
Neutro Abs: 4.2 K/uL (ref 1.7–7.7)
Neutrophils Relative %: 61 %
Platelet Count: 253 K/uL (ref 150–400)
RBC: 4.45 MIL/uL (ref 3.87–5.11)
RDW: 14.7 % (ref 11.5–15.5)
WBC Count: 6.9 K/uL (ref 4.0–10.5)
nRBC: 0 % (ref 0.0–0.2)

## 2024-05-03 LAB — FERRITIN: Ferritin: 26 ng/mL (ref 11–307)

## 2024-05-03 NOTE — Telephone Encounter (Signed)
 Patient has been scheduled for follow-up visit per 05/03/24 LOS.  Pt given an appt calendar with date and time.

## 2024-05-04 ENCOUNTER — Telehealth: Payer: Self-pay

## 2024-05-04 NOTE — Telephone Encounter (Signed)
  Latest Reference Range & Units 05/03/24 13:44   Iron  28 - 170 ug/dL 54  UIBC ug/dL 658  TIBC 749 - 549 ug/dL 604  Saturation Ratios 10.4 - 31.8 % 14  Ferritin 11 - 307 ng/mL 26      ASSESSMENT & PLAN:  An 83 y.o. female with iron  deficiency anemia.  I remain pleased with both her iron  and hemoglobin levels today.  Clinically, she appears to be doing well.  As that is the case, I will see her back in 6 months for repeat clinical assessment.  The patient understands all the plans discussed today and is in agreement with them.   Dequincy DELENA Kerns, MD

## 2024-07-08 ENCOUNTER — Other Ambulatory Visit: Payer: Self-pay | Admitting: Cardiology

## 2024-10-26 ENCOUNTER — Ambulatory Visit: Admitting: Cardiology

## 2024-10-31 ENCOUNTER — Other Ambulatory Visit

## 2024-10-31 ENCOUNTER — Ambulatory Visit: Admitting: Oncology
